# Patient Record
Sex: Female | Born: 1980 | Race: Black or African American | Hispanic: No | Marital: Single | Smoking: Current every day smoker
Health system: Southern US, Community
[De-identification: ages and names within clinical notes are randomized; demographics above are authoritative.]

## PROBLEM LIST (undated history)

## (undated) HISTORY — PX: TONSILLECTOMY: SUR1361

## (undated) HISTORY — PX: ADENOIDECTOMY: SUR15

## (undated) HISTORY — PX: THROAT SURGERY: SHX803

---

## 2004-05-24 ENCOUNTER — Emergency Department (HOSPITAL_COMMUNITY): Admission: EM | Admit: 2004-05-24 | Discharge: 2004-05-24 | Payer: Self-pay | Admitting: Emergency Medicine

## 2005-05-17 ENCOUNTER — Emergency Department (HOSPITAL_COMMUNITY): Admission: EM | Admit: 2005-05-17 | Discharge: 2005-05-17 | Payer: Self-pay | Admitting: Emergency Medicine

## 2005-09-15 ENCOUNTER — Emergency Department (HOSPITAL_COMMUNITY): Admission: EM | Admit: 2005-09-15 | Discharge: 2005-09-15 | Payer: Self-pay | Admitting: Emergency Medicine

## 2006-10-06 ENCOUNTER — Emergency Department (HOSPITAL_COMMUNITY): Admission: EM | Admit: 2006-10-06 | Discharge: 2006-10-06 | Payer: Self-pay | Admitting: Emergency Medicine

## 2007-01-09 ENCOUNTER — Emergency Department (HOSPITAL_COMMUNITY): Admission: EM | Admit: 2007-01-09 | Discharge: 2007-01-09 | Payer: Self-pay | Admitting: Emergency Medicine

## 2007-12-11 ENCOUNTER — Encounter: Admission: RE | Admit: 2007-12-11 | Discharge: 2007-12-11 | Payer: Self-pay | Admitting: Family Medicine

## 2007-12-19 ENCOUNTER — Other Ambulatory Visit: Admission: RE | Admit: 2007-12-19 | Discharge: 2007-12-19 | Payer: Self-pay | Admitting: Interventional Radiology

## 2007-12-19 ENCOUNTER — Encounter: Admission: RE | Admit: 2007-12-19 | Discharge: 2007-12-19 | Payer: Self-pay | Admitting: Family Medicine

## 2007-12-19 ENCOUNTER — Encounter (INDEPENDENT_AMBULATORY_CARE_PROVIDER_SITE_OTHER): Payer: Self-pay | Admitting: Interventional Radiology

## 2007-12-22 ENCOUNTER — Encounter: Admission: RE | Admit: 2007-12-22 | Discharge: 2007-12-22 | Payer: Self-pay | Admitting: Family Medicine

## 2008-05-17 HISTORY — PX: CYSTOSCOPY: SUR368

## 2010-02-10 LAB — TSH: TSH: 1.46 u[IU]/mL (ref <=5.90)

## 2010-02-10 LAB — BASIC METABOLIC PANEL
BUN: 13 mg/dL (ref 4–21)
Creatinine: 0.8 mg/dL (ref <=1.1)
Glucose: 77 mg/dL
Potassium: 4.5 mmol/L (ref 3.4–5.3)
Sodium: 140 mmol/L (ref 137–147)

## 2011-02-26 LAB — BASIC METABOLIC PANEL
BUN: 5 — ABNORMAL LOW
CO2: 23
Calcium: 8.6
Chloride: 103
Creatinine, Ser: 0.6
GFR calc Af Amer: >60
GFR calc non Af Amer: >60
Glucose, Bld: 115 — ABNORMAL HIGH
Potassium: 3.1 — ABNORMAL LOW
Sodium: 134 — ABNORMAL LOW

## 2011-02-26 LAB — DIFFERENTIAL
Basophils Absolute: 0
Basophils Relative: 0
Eosinophils Absolute: 0.1
Eosinophils Relative: 1
Lymphocytes Relative: 18
Lymphs Abs: 2
Monocytes Absolute: 0.6
Monocytes Relative: 6
Neutro Abs: 8.2 — ABNORMAL HIGH
Neutrophils Relative %: 76

## 2011-02-26 LAB — URINALYSIS, ROUTINE W REFLEX MICROSCOPIC
Bilirubin Urine: NEGATIVE
Glucose, UA: NEGATIVE
Hgb urine dipstick: NEGATIVE
Ketones, ur: NEGATIVE
Nitrite: NEGATIVE
Protein, ur: NEGATIVE
Specific Gravity, Urine: >1.03 — ABNORMAL HIGH
Urobilinogen, UA: 0.2
pH: 6

## 2011-02-26 LAB — CBC
HCT: 35.1 — ABNORMAL LOW
Hemoglobin: 12.4
MCHC: 35.2
MCV: 86.3
Platelets: 251
RBC: 4.07
RDW: 14.2 — ABNORMAL HIGH
WBC: 10.9 — ABNORMAL HIGH

## 2011-02-26 LAB — PREGNANCY, URINE: Preg Test, Ur: POSITIVE

## 2011-02-26 LAB — HCG, QUANTITATIVE, PREGNANCY: hCG, Beta Chain, Quant, S: 26412 — ABNORMAL HIGH

## 2011-04-05 ENCOUNTER — Encounter (HOSPITAL_COMMUNITY): Payer: Self-pay | Admitting: *Deleted

## 2011-04-05 ENCOUNTER — Emergency Department (HOSPITAL_COMMUNITY)
Admission: EM | Admit: 2011-04-05 | Discharge: 2011-04-05 | Disposition: A | Discharge status: Home / Self Care | Payer: Medicaid Other | Attending: Emergency Medicine | Admitting: Emergency Medicine

## 2011-04-05 DIAGNOSIS — R5381 Other malaise: Secondary | ICD-10-CM | POA: Insufficient documentation

## 2011-04-05 DIAGNOSIS — F17200 Nicotine dependence, unspecified, uncomplicated: Secondary | ICD-10-CM | POA: Insufficient documentation

## 2011-04-05 DIAGNOSIS — R109 Unspecified abdominal pain: Secondary | ICD-10-CM | POA: Insufficient documentation

## 2011-04-05 DIAGNOSIS — R51 Headache: Secondary | ICD-10-CM | POA: Insufficient documentation

## 2011-04-05 DIAGNOSIS — N390 Urinary tract infection, site not specified: Secondary | ICD-10-CM

## 2011-04-05 DIAGNOSIS — R070 Pain in throat: Secondary | ICD-10-CM | POA: Insufficient documentation

## 2011-04-05 LAB — URINALYSIS, ROUTINE W REFLEX MICROSCOPIC
Bilirubin Urine: NEGATIVE
Glucose, UA: NEGATIVE mg/dL
Hgb urine dipstick: NEGATIVE
Ketones, ur: NEGATIVE mg/dL
Nitrite: NEGATIVE
Protein, ur: NEGATIVE mg/dL
Specific Gravity, Urine: 1.02 (ref 1.005–1.030)
Urobilinogen, UA: 0.2 mg/dL (ref 0.0–1.0)
pH: 6 (ref 5.0–8.0)

## 2011-04-05 LAB — URINE MICROSCOPIC-ADD ON

## 2011-04-05 LAB — PREGNANCY, URINE: Preg Test, Ur: NEGATIVE

## 2011-04-05 MED ORDER — IBUPROFEN 800 MG PO TABS
800.0000 mg | ORAL_TABLET | Freq: Once | ORAL | Status: AC
  Administered 2011-04-05: 800 mg via ORAL
  Filled 2011-04-05: qty 1

## 2011-04-05 MED ORDER — NITROFURANTOIN MACROCRYSTAL 100 MG PO CAPS: 100.0000 mg | ORAL_CAPSULE | Freq: Four times a day (QID) | ORAL | Status: AC

## 2011-04-05 MED ORDER — GUAIFENESIN-CODEINE 100-10 MG/5ML PO SOLN
5.0000 mL | Freq: Once | ORAL | Status: AC
  Administered 2011-04-05: 5 mL via ORAL
  Filled 2011-04-05: qty 5

## 2011-04-05 MED ORDER — NITROFURANTOIN MACROCRYSTAL 100 MG PO CAPS
100.0000 mg | ORAL_CAPSULE | Freq: Once | ORAL | Status: AC
  Administered 2011-04-05: 100 mg via ORAL
  Filled 2011-04-05: qty 1

## 2011-04-05 NOTE — ED Notes (Signed)
Pt c/o fever and chills x 2 weeks. Pt also c/o whitish vaginal discharge and sore throat x 1 week.

## 2011-04-05 NOTE — ED Provider Notes (Signed)
History     CSN: [id number] Arrival date & time: 04/05/2011 12:48 AM   First MD Initiated Contact with Patient 04/05/11 0232      Chief Complaint  Patient presents with  . Sore Throat  . Fever  . Vaginal Discharge  . Chills    (Consider location/radiation/quality/duration/timing/severity/associated sxs/prior treatment) HPI Comments: Cristina Hicks is a 30 y.o. female who presents to the Emergency Department complaining of generalized weakness, myalgias, dry cough, sore throat, lower abdominal pain that began two days ago. She may have had fever but did not take her temperature. Associated with chills. Denies shortness of breath, chest pain, nausea, vomiting, diarrhea.She has taken nothing for the symptoms.   Patient is a 30 y.o. female presenting with weakness. The history is provided by the patient.  Weakness The primary symptoms include headaches and fever. Primary symptoms comment: body aches The symptoms began 2 days ago. The symptoms are unchanged.  The headache is associated with weakness.   Additional symptoms include weakness.    History reviewed. No pertinent past medical history.  Past Surgical History  Procedure Date  . Throat surgery     History reviewed. No pertinent family history.  History  Substance Use Topics  . Smoking status: Current Everyday Smoker -- 1.0 packs/day    Types: Cigarettes  . Smokeless tobacco: Not on file  . Alcohol Use: No    OB History    Grav Para Term Preterm Abortions TAB SAB Ect Mult Living                  Review of Systems  Constitutional: Positive for fever.  Neurological: Positive for weakness and headaches.  All other systems reviewed and are negative.    Allergies  Review of patient's allergies indicates no known allergies.  Home Medications  No current outpatient prescriptions on file.  BP 122/81  Pulse 67  Temp 97.5 F (36.4 C)  Resp 20  Ht 5\' 6"  (1.676 m)  Wt 284 lb (128.822 kg)  BMI 45.84 kg/m2   SpO2 99%  LMP 03/06/2011  Physical Exam  Nursing note and vitals reviewed. Constitutional: She is oriented to person, place, and time. She appears well-developed and well-nourished. No distress.  HENT:  Head: Normocephalic and atraumatic.  Right Ear: External ear normal.  Left Ear: External ear normal.  Mouth/Throat: Oropharynx is clear and moist.  Eyes: EOM are normal.  Neck: Normal range of motion. Neck supple.  Cardiovascular: Normal rate, normal heart sounds and intact distal pulses.   Pulmonary/Chest: Effort normal and breath sounds normal. No respiratory distress. She has no wheezes. She has no rales.  Abdominal: Soft. She exhibits no distension. There is tenderness. There is no rebound and no guarding.       Mild suprapubic tenderness  Genitourinary:       No cva tenderness  Musculoskeletal: Normal range of motion.  Neurological: She is alert and oriented to person, place, and time.  Skin: Skin is warm and dry.    ED Course  Procedures (including critical care time)  Results for orders placed during the hospital encounter of 04/05/11  URINALYSIS, ROUTINE W REFLEX MICROSCOPIC      Component Value Range   Color, Urine YELLOW  YELLOW    Appearance HAZY (*) CLEAR    Specific Gravity, Urine 1.020  1.005 - 1.030    pH 6.0  5.0 - 8.0    Glucose, UA NEGATIVE  NEGATIVE (mg/dL)   Hgb urine dipstick NEGATIVE  NEGATIVE  Bilirubin Urine NEGATIVE  NEGATIVE    Ketones, ur NEGATIVE  NEGATIVE (mg/dL)   Protein, ur NEGATIVE  NEGATIVE (mg/dL)   Urobilinogen, UA 0.2  0.0 - 1.0 (mg/dL)   Nitrite NEGATIVE  NEGATIVE    Leukocytes, UA SMALL (*) NEGATIVE   PREGNANCY, URINE      Component Value Range   Preg Test, Ur NEGATIVE    URINE MICROSCOPIC-ADD ON      Component Value Range   Squamous Epithelial / LPF FEW (*) RARE    WBC, UA 11-20  <3 (WBC/hpf)   RBC / HPF 0-2  <3 (RBC/hpf)   Bacteria, UA MANY (*) RARE    Urine-Other CLUE CELLS PRESENT       No diagnosis found.    MDM   Patient with generalized malaise and positive for urinary tract infection. No CVA tenderness or indication of pyelonephitis. Began antibiotic treatment.The patient appears reasonably screened and/or stabilized for discharge and I doubt any other medical condition or other [facility] requiring further screening, evaluation, or treatment in the ED at this time prior to discharge.  MDM Reviewed: nursing note and vitals Interpretation: labs           Nicoletta Dress. Colon Branch, MD 04/05/11 [phone]

## 2011-04-30 ENCOUNTER — Encounter (HOSPITAL_COMMUNITY): Payer: Self-pay | Admitting: Emergency Medicine

## 2011-04-30 ENCOUNTER — Emergency Department (HOSPITAL_COMMUNITY)
Admission: EM | Admit: 2011-04-30 | Discharge: 2011-04-30 | Disposition: A | Discharge status: Home / Self Care | Payer: Medicaid Other | Attending: Emergency Medicine | Admitting: Emergency Medicine

## 2011-04-30 DIAGNOSIS — Z9889 Other specified postprocedural states: Secondary | ICD-10-CM | POA: Insufficient documentation

## 2011-04-30 DIAGNOSIS — M545 Low back pain, unspecified: Secondary | ICD-10-CM | POA: Insufficient documentation

## 2011-04-30 DIAGNOSIS — M79609 Pain in unspecified limb: Secondary | ICD-10-CM | POA: Insufficient documentation

## 2011-04-30 DIAGNOSIS — R209 Unspecified disturbances of skin sensation: Secondary | ICD-10-CM | POA: Insufficient documentation

## 2011-04-30 DIAGNOSIS — F17200 Nicotine dependence, unspecified, uncomplicated: Secondary | ICD-10-CM | POA: Insufficient documentation

## 2011-04-30 MED ORDER — DIAZEPAM 5 MG PO TABS
5.0000 mg | ORAL_TABLET | Freq: Once | ORAL | Status: AC
  Administered 2011-04-30: 5 mg via ORAL
  Filled 2011-04-30: qty 1

## 2011-04-30 MED ORDER — HYDROMORPHONE HCL PF 1 MG/ML IJ SOLN
1.0000 mg | Freq: Once | INTRAMUSCULAR | Status: AC
  Administered 2011-04-30: 1 mg via INTRAMUSCULAR
  Filled 2011-04-30: qty 1

## 2011-04-30 MED ORDER — IBUPROFEN 800 MG PO TABS: 800.0000 mg | ORAL_TABLET | Freq: Three times a day (TID) | ORAL | Status: AC

## 2011-04-30 MED ORDER — HYDROCODONE-ACETAMINOPHEN 5-325 MG PO TABS: 1.0000 | ORAL_TABLET | ORAL | Status: AC | PRN

## 2011-04-30 MED ORDER — DIAZEPAM 5 MG PO TABS: 5.0000 mg | ORAL_TABLET | Freq: Two times a day (BID) | ORAL | Status: AC

## 2011-04-30 MED ORDER — KETOROLAC TROMETHAMINE 60 MG/2ML IM SOLN
60.0000 mg | Freq: Once | INTRAMUSCULAR | Status: AC
  Administered 2011-04-30: 60 mg via INTRAMUSCULAR
  Filled 2011-04-30: qty 2

## 2011-04-30 NOTE — ED Notes (Signed)
Started yesterday morning spasming back pain worse today unable to walk hurts worse with movement

## 2011-04-30 NOTE — ED Provider Notes (Signed)
History     CSN: [id number] Arrival date & time: 04/30/2011  9:23 AM   First MD Initiated Contact with Patient 04/30/11 [phone]      Chief Complaint  Patient presents with  . Back Pain    (Consider location/radiation/quality/duration/timing/severity/associated sxs/prior treatment) HPI Pt c/o diffuse low back w/ radiation to upper back and legs since yesterday.  Aggravated by certain positions and walking.  No relief w/ tylenol.  Associated w/ bilateral foot numbness.  Denies fever, bladder/bowel dysfunction and lower extremity weakness.  Denies trauma.  Has had similar symptoms in the past.   History reviewed. No pertinent past medical history.  Past Surgical History  Procedure Date  . Throat surgery   . Cystoscopy 2010    removed from thyroid    No family history on file.  History  Substance Use Topics  . Smoking status: Current Everyday Smoker -- 0.5 packs/day for 5 years    Types: Cigarettes  . Smokeless tobacco: Not on file  . Alcohol Use: Yes     occausional    OB History    Grav Para Term Preterm Abortions TAB SAB Ect Mult Living                  Review of Systems  All other systems reviewed and are negative.    Allergies  Review of patient's allergies indicates no known allergies.  Home Medications   Current Outpatient Rx  Name Route Sig Dispense Refill  . ACETAMINOPHEN 500 MG PO TABS Oral Take 1,000 mg by mouth every 6 (six) hours as needed. For back pain     . DIAZEPAM 5 MG PO TABS Oral Take 1 tablet (5 mg total) by mouth 2 (two) times daily. 10 tablet 0  . HYDROCODONE-ACETAMINOPHEN 5-325 MG PO TABS Oral Take 1 tablet by mouth every 4 (four) hours as needed for pain. 20 tablet 0  . IBUPROFEN 800 MG PO TABS Oral Take 1 tablet (800 mg total) by mouth 3 (three) times daily. 12 tablet 0    BP 126/77  Pulse 88  Temp(Src) 98.1 F (36.7 C) (Oral)  Resp 20  SpO2 98%  LMP 04/20/2011  Physical Exam  Nursing note and vitals reviewed. Constitutional:  She is oriented to person, place, and time. She appears well-developed and well-nourished.  HENT:  Head: Normocephalic and atraumatic.  Eyes:       Normal appearance  Neck: Normal range of motion.  Cardiovascular: Normal rate and regular rhythm.   Pulmonary/Chest: Effort normal and breath sounds normal.  Musculoskeletal:       Lumbar spinal and paraspinal ttp. No CVA ttp. Full ROM of LE.  Nml patellar reflexes.  No saddle anesthesia. Distal sensation intact.  2+ DP pulses.  Ambulates w/out diffulty.   Neurological: She is alert and oriented to person, place, and time.  Skin: Skin is warm and dry. No rash noted.  Psychiatric: She has a normal mood and affect. Her behavior is normal.    ED Course  Procedures (including critical care time)  Labs Reviewed - No data to display No results found.   1. Low back pain       MDM  Healthy 30yo F presents w/ non-traumatic low back pain.   Has intermittent spasming.  No red flag s/sx.  Pt received valium and toradol w/ a small amt of relief.  Then treated her w/ 1mg  IM dilaudid.  She is ambulating w/out difficulty.   Most likely has a lumbar strain.  Recommended rest, NSAID,  ice/heat and f/u with her PCP if pain has not started to improve in 7 days.  Referred to NS as well.  Return precautions discussed.         Cristina Hicks, [place] 04/30/11 2022

## 2011-05-01 NOTE — ED Provider Notes (Signed)
Medical screening examination/treatment/procedure(s) were performed by non-physician practitioner and as supervising physician I was immediately available for consultation/collaboration.    Diella Gillingham R Riggs Dineen, MD 05/01/11 0719 

## 2011-12-12 ENCOUNTER — Encounter (HOSPITAL_COMMUNITY): Payer: Self-pay

## 2011-12-12 ENCOUNTER — Emergency Department (HOSPITAL_COMMUNITY)
Admission: EM | Admit: 2011-12-12 | Discharge: 2011-12-12 | Disposition: A | Discharge status: Home / Self Care | Payer: Medicaid Other | Attending: Emergency Medicine | Admitting: Emergency Medicine

## 2011-12-12 ENCOUNTER — Other Ambulatory Visit: Payer: Self-pay

## 2011-12-12 DIAGNOSIS — R002 Palpitations: Secondary | ICD-10-CM | POA: Insufficient documentation

## 2011-12-12 DIAGNOSIS — F17200 Nicotine dependence, unspecified, uncomplicated: Secondary | ICD-10-CM | POA: Insufficient documentation

## 2011-12-12 LAB — COMPREHENSIVE METABOLIC PANEL
ALT: 9 U/L (ref 0–35)
Alkaline Phosphatase: 87 U/L (ref 39–117)
CO2: 30 mEq/L (ref 19–32)
GFR calc Af Amer: >90 mL/min (ref >90)
GFR calc non Af Amer: >90 mL/min (ref >90)
Glucose, Bld: 83 mg/dL (ref 70–99)
Potassium: 3.4 mEq/L — ABNORMAL LOW (ref 3.5–5.1)
Sodium: 142 mEq/L (ref 135–145)

## 2011-12-12 LAB — CBC WITH DIFFERENTIAL/PLATELET
Lymphocytes Relative: 27 % (ref 12–46)
Lymphs Abs: 2.3 10*3/uL (ref 0.7–4.0)
Neutrophils Relative %: 65 % (ref 43–77)
Platelets: 231 10*3/uL (ref 150–400)
RBC: 4.14 MIL/uL (ref 3.87–5.11)
WBC: 8.4 10*3/uL (ref 4.0–10.5)

## 2011-12-12 NOTE — ED Notes (Signed)
Heart has felt funny for 3 weeks. Last night I had sharp pains in my shoulder and I went to sleep and when I woke up it was gone per pt. Feet have been swelling per pt.

## 2011-12-12 NOTE — ED Provider Notes (Signed)
History  This chart was scribed for Geoffery Lyons, MD by Ladona Ridgel Day. This patient was seen in room APA06/APA06 and the patient's care was started at 2034.   CSN: [id number]  Arrival date & time 12/12/11  2034   First MD Initiated Contact with Patient 12/12/11 2046      Chief Complaint  Patient presents with  . Palpitations    Patient is a 31 y.o. female presenting with palpitations. The history is provided by the patient. No language interpreter was used.  Palpitations  The current episode started more than 1 week ago. The problem occurs daily. The problem has been resolved. Associated with: Nothing.  Pertinent negatives include no diaphoresis and no chest pain. She has tried nothing for the symptoms.   Cristina Hicks is a 31 y.o. female who presents to the Emergency Department complaining of ongoing intermittent palpitations for past several weeks. She states her symptoms only lasted a few seconds and denies any precipitating factors. She states her symptoms seem resolved now but they randomly come back on a daily basis several times a day. She states when her symptoms do return, her heart feels funny. She states does mild lifting at the [employer] where she works. She denies medical history and any other injuries or illnesses.   [facility] Edelman is PCP  History reviewed. No pertinent past medical history.  Past Surgical History  Procedure Date  . Throat surgery   . Cystoscopy 2010    removed from thyroid    History reviewed. No pertinent family history.  History  Substance Use Topics  . Smoking status: Current Everyday Smoker -- 0.5 packs/day for 5 years    Types: Cigarettes  . Smokeless tobacco: Not on file  . Alcohol Use: Yes     occausional    OB History    Grav Para Term Preterm Abortions TAB SAB Ect Mult Living                  Review of Systems  Constitutional: Negative for diaphoresis.  Cardiovascular: Positive for palpitations. Negative for chest pain.   All other systems reviewed and are negative.   A complete 10 system review of systems was obtained and all systems are negative except as noted in the HPI and PMH.   Allergies  Review of patient's allergies indicates no known allergies.  Home Medications   No current outpatient prescriptions on file.  Triage Vitals: BP 127/70  Pulse 71  Temp 98 F (36.7 C) (Oral)  Resp 14  Ht 5\' 6"  (1.676 m)  Wt 270 lb (122.471 kg)  BMI 43.58 kg/m2  SpO2 100%  LMP 11/20/2011  Physical Exam  Nursing note and vitals reviewed. Constitutional: She is oriented to person, place, and time. She appears well-developed and well-nourished. No distress.  HENT:  Head: Normocephalic and atraumatic.  Mouth/Throat: Oropharynx is clear and moist.  Eyes: Conjunctivae and EOM are normal.  Neck: Normal range of motion. Neck supple. No tracheal deviation present.  Cardiovascular: Normal rate, regular rhythm and normal heart sounds.   Pulmonary/Chest: Effort normal and breath sounds normal. No respiratory distress. She has no wheezes. She has no rales.  Abdominal: Soft. Bowel sounds are normal. She exhibits no distension. There is no tenderness. There is no rebound and no guarding.  Musculoskeletal: Normal range of motion.  Neurological: She is alert and oriented to person, place, and time.  Skin: Skin is warm and dry.  Psychiatric: She has a normal mood and affect. Her  behavior is normal.    ED Course  Procedures (including critical care time) DIAGNOSTIC STUDIES: Oxygen Saturation is 100% on room air, normal by my interpretation.    COORDINATION OF CARE: At 39 PM Discussed treatment plan with patient which includes observation and blood work. Patient agrees.    Labs Reviewed  TSH  CBC WITH DIFFERENTIAL  COMPREHENSIVE METABOLIC PANEL  TROPONIN I   No results found.   No diagnosis found.   Date: 12/12/2011  Rate: 73  Rhythm: sinus rhythm  QRS Axis: normal  Intervals: normal  ST/T Wave  abnormalities: normal  Conduction Disutrbances:none  Narrative Interpretation:   Old EKG Reviewed: none available    MDM  The patient presents with palpitations intermittently for the past several weeks.  The ekg and electrolytes are normal with a tsh pending.  She has been observed on the cardiac monitor for the past 2 hours and has had no symptoms or ectopy.  At this point I feel she is stable for discharge with referral to cardiology to discuss Holter monitoring.  She is to return for any problems or if symptoms worsen.      I personally performed the services described in this documentation, which was scribed in my presence. The recorded information has been reviewed and considered.           Geoffery Lyons, MD 12/12/11 2231

## 2011-12-12 NOTE — ED Notes (Addendum)
Patient requesting for Dr. Judd Lien to look at rash on left armpit. States that she has history of eczema, but this looks different and she normally doesn't get eczema in that area. Dr. Judd Lien notified.

## 2012-08-30 ENCOUNTER — Encounter: Payer: Self-pay | Admitting: Family Medicine

## 2012-08-30 ENCOUNTER — Ambulatory Visit: Payer: Self-pay | Admitting: Nurse Practitioner

## 2012-08-30 ENCOUNTER — Other Ambulatory Visit: Payer: Self-pay

## 2012-09-05 ENCOUNTER — Emergency Department (HOSPITAL_COMMUNITY)
Admission: EM | Admit: 2012-09-05 | Discharge: 2012-09-05 | Disposition: A | Discharge status: Home / Self Care | Payer: Medicaid Other | Attending: Emergency Medicine | Admitting: Emergency Medicine

## 2012-09-05 ENCOUNTER — Encounter (HOSPITAL_COMMUNITY): Payer: Self-pay | Admitting: *Deleted

## 2012-09-05 DIAGNOSIS — R42 Dizziness and giddiness: Secondary | ICD-10-CM | POA: Insufficient documentation

## 2012-09-05 DIAGNOSIS — R112 Nausea with vomiting, unspecified: Secondary | ICD-10-CM | POA: Insufficient documentation

## 2012-09-05 DIAGNOSIS — F17200 Nicotine dependence, unspecified, uncomplicated: Secondary | ICD-10-CM | POA: Insufficient documentation

## 2012-09-05 DIAGNOSIS — Z3202 Encounter for pregnancy test, result negative: Secondary | ICD-10-CM | POA: Insufficient documentation

## 2012-09-05 LAB — HCG, QUANTITATIVE, PREGNANCY: hCG, Beta Chain, Quant, S: <1 m[IU]/mL (ref <5)

## 2012-09-05 LAB — URINALYSIS, ROUTINE W REFLEX MICROSCOPIC
Bilirubin Urine: NEGATIVE
Hgb urine dipstick: NEGATIVE
Protein, ur: NEGATIVE mg/dL
Specific Gravity, Urine: 1.015 (ref 1.005–1.030)
Urobilinogen, UA: 0.2 mg/dL (ref 0.0–1.0)

## 2012-09-05 LAB — CBC WITH DIFFERENTIAL/PLATELET
Basophils Absolute: 0 10*3/uL (ref 0.0–0.1)
Basophils Relative: 0 % (ref 0–1)
Eosinophils Absolute: 0.2 10*3/uL (ref 0.0–0.7)
Hemoglobin: 13.4 g/dL (ref 12.0–15.0)
MCHC: 35.5 g/dL (ref 30.0–36.0)
Neutro Abs: 8.6 10*3/uL — ABNORMAL HIGH (ref 1.7–7.7)
Neutrophils Relative %: 72 % (ref 43–77)
Platelets: 289 10*3/uL (ref 150–400)
RDW: 13.4 % (ref 11.5–15.5)

## 2012-09-05 LAB — COMPREHENSIVE METABOLIC PANEL
ALT: 10 U/L (ref 0–35)
CO2: 29 mEq/L (ref 19–32)
Calcium: 9.3 mg/dL (ref 8.4–10.5)
Chloride: 103 mEq/L (ref 96–112)
GFR calc Af Amer: 89 mL/min — ABNORMAL LOW (ref >90)
GFR calc non Af Amer: 77 mL/min — ABNORMAL LOW (ref >90)
Glucose, Bld: 97 mg/dL (ref 70–99)
Sodium: 140 mEq/L (ref 135–145)
Total Bilirubin: 0.1 mg/dL — ABNORMAL LOW (ref 0.3–1.2)

## 2012-09-05 MED ORDER — SODIUM CHLORIDE 0.9 % IV BOLUS (SEPSIS)
1000.0000 mL | Freq: Once | INTRAVENOUS | Status: AC
  Administered 2012-09-05: 1000 mL via INTRAVENOUS

## 2012-09-05 NOTE — ED Provider Notes (Signed)
History  This chart was scribed for Loren Racer, MD by Shari Heritage, ED Scribe. The patient was seen in room APA07/APA07. Patient's care was started at 1930.   CSN: [id number]  Arrival date & time 09/05/12  1840   First MD Initiated Contact with Patient 09/05/12 1930      Chief Complaint  Patient presents with  . Dizziness    Patient is a 32 y.o. female presenting with female genitourinary complaint. The history is provided by the patient. No language interpreter was used.  Female GU Problem Primary symptoms include no discharge, no pelvic pain and no dysuria. There has been no fever. This is a new problem. The current episode started 12 to 24 hours ago. The problem occurs constantly. The problem has not changed since onset.LMP: March. The patient's menstrual history has been irregular (reports only spotting). Associated symptoms include light-headedness. Pertinent negatives include no abdominal pain, no nausea, no vomiting and no frequency.    HPI Comments: Cristina Hicks is a 32 y.o. female who presents to the Emergency Department complaining of lightheadedness with sudden position changes for the past 1-2 days. There is associated nausea and vomiting. She states that she had a couple of episodes of vomiting earlier today, but none in the ED. Patient is also concerned that she may be pregnant. She states that her LNMP was in February. She says that she usually has menstrual periods every  onth. She says that in March she had only vaginal spotting. Patient took two pregnancy tests at home, one was positive and one was negative. She states that she has not noticed any vaginal discharge or bleeding this month. Patient denies abdominal pain, dysuria, urinary frequency, fever, shills, chest pain, shortness of breath or any other symptoms.    History reviewed. No pertinent past medical history.  Past Surgical History  Procedure Laterality Date  . Throat surgery    . Cystoscopy  2010     removed from thyroid    History reviewed. No pertinent family history.  History  Substance Use Topics  . Smoking status: Current Every Day Smoker -- 0.50 packs/day for 5 years    Types: Cigarettes  . Smokeless tobacco: Not on file  . Alcohol Use: Yes     Comment: occausional    OB History   Grav Para Term Preterm Abortions TAB SAB Ect Mult Living                  Review of Systems  Constitutional: Negative for fever and chills.  HENT: Negative for congestion and rhinorrhea.   Eyes: Negative for visual disturbance.  Respiratory: Negative for cough and shortness of breath.   Cardiovascular: Negative for chest pain.  Gastrointestinal: Negative for nausea, vomiting and abdominal pain.  Genitourinary: Negative for dysuria, frequency and pelvic pain.  Musculoskeletal: Negative for back pain.  Skin: Negative for rash.  Neurological: Positive for light-headedness.  Psychiatric/Behavioral: Negative for confusion.    Allergies  Review of patient's allergies indicates no known allergies.  Home Medications   Current Outpatient Rx  Name  Route  Sig  Dispense  Refill  . Vitamin D, Ergocalciferol, (DRISDOL) 50000 UNITS CAPS   Oral   Take 50,000 Units by mouth every 7 (seven) days. SUNDAYS           Triage Vitals: BP 130/72  Pulse 87  Temp(Src) 99.3 F (37.4 C) (Oral)  Resp 18  Ht 5\' 6"  (1.676 m)  Wt 262 lb (118.842 kg)  BMI 42.31  kg/m2  SpO2 100%  Physical Exam  Constitutional: She is oriented to person, place, and time. She appears well-developed and well-nourished.  HENT:  Head: Normocephalic.  Mouth/Throat: Oropharynx is clear and moist.  Eyes: Conjunctivae and EOM are normal. Pupils are equal, round, and reactive to light.  Neck: Normal range of motion. Neck supple.  Cardiovascular: Normal rate, regular rhythm and normal heart sounds.   No murmur heard. Pulmonary/Chest: Effort normal and breath sounds normal.  Abdominal: Soft. There is no tenderness.   Musculoskeletal: Normal range of motion.  Neurological: She is alert and oriented to person, place, and time.  Skin: Skin is warm and dry.    ED Course  Procedures (including critical care time) DIAGNOSTIC STUDIES: Oxygen Saturation is 100% on room air, normal by my interpretation.    COORDINATION OF CARE: 7:50 PM- Patient informed of current plan for treatment and evaluation and agrees with plan at this time.      Labs Reviewed  URINALYSIS, ROUTINE W REFLEX MICROSCOPIC - Abnormal; Notable for the following:    Ketones, ur TRACE (*)    All other components within normal limits  COMPREHENSIVE METABOLIC PANEL - Abnormal; Notable for the following:    Albumin 3.0 (*)    Total Bilirubin 0.1 (*)    GFR calc non Af Amer 77 (*)    GFR calc Af Amer 89 (*)    All other components within normal limits  CBC WITH DIFFERENTIAL - Abnormal; Notable for the following:    WBC 12.1 (*)    Neutro Abs 8.6 (*)    All other components within normal limits  PREGNANCY, URINE  HCG, QUANTITATIVE, PREGNANCY   No results found.   1. Lightheadedness      Date: 09/05/2012  Rate: 72  Rhythm: normal sinus rhythm  QRS Axis: normal  Intervals: normal  ST/T Wave abnormalities: normal  Conduction Disutrbances:none  Narrative Interpretation:   Old EKG Reviewed: unchanged    MDM  I personally performed the services described in this documentation, which was scribed in my presence. The recorded information has been reviewed and is accurate.     Loren Racer, MD 09/05/12 2131

## 2012-09-05 NOTE — ED Notes (Signed)
Pt alert & oriented x4, stable gait. Patient given discharge instructions, paperwork & prescription(s). Patient  instructed to stop at the registration desk to finish any additional paperwork. Patient verbalized understanding. Pt left department w/ no further questions. 

## 2012-09-05 NOTE — ED Notes (Signed)
Pt with dizziness since yesterday, took pregnancy test this morning is positive and retook this afternoon pregnancy test and was negative, + nausea and vomiting

## 2012-12-16 ENCOUNTER — Inpatient Hospital Stay (HOSPITAL_COMMUNITY)
Admission: EM | Admit: 2012-12-16 | Discharge: 2012-12-17 | DRG: 392 | Disposition: A | Discharge status: Home / Self Care | Payer: Medicaid Other | Attending: Internal Medicine | Admitting: Internal Medicine

## 2012-12-16 ENCOUNTER — Encounter (HOSPITAL_COMMUNITY): Payer: Self-pay | Admitting: *Deleted

## 2012-12-16 ENCOUNTER — Emergency Department (HOSPITAL_COMMUNITY): Payer: Medicaid Other

## 2012-12-16 DIAGNOSIS — K529 Noninfective gastroenteritis and colitis, unspecified: Secondary | ICD-10-CM

## 2012-12-16 DIAGNOSIS — F17200 Nicotine dependence, unspecified, uncomplicated: Secondary | ICD-10-CM | POA: Diagnosis present

## 2012-12-16 DIAGNOSIS — R509 Fever, unspecified: Secondary | ICD-10-CM

## 2012-12-16 DIAGNOSIS — K5289 Other specified noninfective gastroenteritis and colitis: Principal | ICD-10-CM | POA: Diagnosis present

## 2012-12-16 DIAGNOSIS — E860 Dehydration: Secondary | ICD-10-CM

## 2012-12-16 DIAGNOSIS — D72829 Elevated white blood cell count, unspecified: Secondary | ICD-10-CM | POA: Diagnosis present

## 2012-12-16 LAB — COMPREHENSIVE METABOLIC PANEL
AST: 16 U/L (ref 0–37)
Albumin: 3.7 g/dL (ref 3.5–5.2)
BUN: 15 mg/dL (ref 6–23)
Chloride: 99 mEq/L (ref 96–112)
Creatinine, Ser: 1.04 mg/dL (ref 0.50–1.10)
Potassium: 4 mEq/L (ref 3.5–5.1)
Total Bilirubin: 0.4 mg/dL (ref 0.3–1.2)
Total Protein: 8.3 g/dL (ref 6.0–8.3)

## 2012-12-16 LAB — CBC WITH DIFFERENTIAL/PLATELET
Basophils Absolute: 0 10*3/uL (ref 0.0–0.1)
Basophils Relative: 0 % (ref 0–1)
Eosinophils Absolute: 0.1 10*3/uL (ref 0.0–0.7)
MCH: 30.2 pg (ref 26.0–34.0)
MCHC: 34.8 g/dL (ref 30.0–36.0)
Monocytes Absolute: 1.6 10*3/uL — ABNORMAL HIGH (ref 0.1–1.0)
Monocytes Relative: 7 % (ref 3–12)
Neutro Abs: 20.5 10*3/uL — ABNORMAL HIGH (ref 1.7–7.7)
Neutrophils Relative %: 91 % — ABNORMAL HIGH (ref 43–77)
RDW: 13.6 % (ref 11.5–15.5)

## 2012-12-16 LAB — URINALYSIS, ROUTINE W REFLEX MICROSCOPIC
Bilirubin Urine: NEGATIVE
Ketones, ur: NEGATIVE mg/dL
Leukocytes, UA: NEGATIVE
Nitrite: NEGATIVE

## 2012-12-16 LAB — URINE MICROSCOPIC-ADD ON

## 2012-12-16 LAB — LIPASE, BLOOD: Lipase: 24 U/L (ref 11–59)

## 2012-12-16 LAB — PREGNANCY, URINE: Preg Test, Ur: NEGATIVE

## 2012-12-16 MED ORDER — ACETAMINOPHEN 500 MG PO TABS
1000.0000 mg | ORAL_TABLET | Freq: Once | ORAL | Status: AC
  Administered 2012-12-16: 1000 mg via ORAL
  Filled 2012-12-16: qty 1

## 2012-12-16 MED ORDER — SODIUM CHLORIDE 0.9 % IV SOLN
INTRAVENOUS | Status: DC
  Administered 2012-12-16: 1000 mL via INTRAVENOUS
  Administered 2012-12-17: 03:00:00 via INTRAVENOUS

## 2012-12-16 MED ORDER — MORPHINE SULFATE 4 MG/ML IJ SOLN
4.0000 mg | INTRAMUSCULAR | Status: DC | PRN
  Administered 2012-12-16: 4 mg via INTRAVENOUS
  Filled 2012-12-16: qty 1

## 2012-12-16 MED ORDER — IOHEXOL 300 MG/ML  SOLN: 50.0000 mL | Freq: Once | INTRAMUSCULAR | Status: AC | PRN

## 2012-12-16 MED ORDER — ONDANSETRON HCL 4 MG/2ML IJ SOLN
4.0000 mg | INTRAMUSCULAR | Status: DC | PRN
  Administered 2012-12-16: 4 mg via INTRAVENOUS
  Filled 2012-12-16: qty 2

## 2012-12-16 MED ORDER — IOHEXOL 300 MG/ML  SOLN
50.0000 mL | Freq: Once | INTRAMUSCULAR | Status: AC | PRN
  Administered 2012-12-16: 50 mL via ORAL

## 2012-12-16 MED ORDER — IOHEXOL 300 MG/ML  SOLN
100.0000 mL | Freq: Once | INTRAMUSCULAR | Status: AC | PRN
  Administered 2012-12-16: 100 mL via INTRAVENOUS

## 2012-12-16 MED ORDER — FAMOTIDINE IN NACL 20-0.9 MG/50ML-% IV SOLN
20.0000 mg | Freq: Once | INTRAVENOUS | Status: AC
  Administered 2012-12-16: 20 mg via INTRAVENOUS
  Filled 2012-12-16: qty 50

## 2012-12-16 NOTE — ED Notes (Signed)
Pt reporting abdominal pain, nausea and vomiting starting this morning.

## 2012-12-16 NOTE — ED Provider Notes (Addendum)
CSN: [id number]     Arrival date & time 12/16/12  1935 History     First MD Initiated Contact with Patient 12/16/12 2000     Chief Complaint  Patient presents with  . Abdominal Pain  . Nausea  . Emesis    HPI Pt was seen at 2015.  Per pt, c/o gradual onset and persistence of constant upper abd "pain" since this afternoon approx 1300. Pt states the pain began after she ate a hotdog and a hamburger. Has been associated with multiple intermittent episodes of N/V/D.  Describes the abd pain as "cramping."  Denies fevers, no back pain, no rash, no CP/SOB, no black or blood in stools or emesis.      History reviewed. No pertinent past medical history.   Past Surgical History  Procedure Laterality Date  . Throat surgery    . Cystoscopy  2010    removed from thyroid    History  Substance Use Topics  . Smoking status: Current Every Day Smoker -- 1.00 packs/day for 5 years    Types: Cigarettes  . Smokeless tobacco: Not on file  . Alcohol Use: Yes     Comment: occasional    Review of Systems ROS: Statement: All systems negative except as marked or noted in the HPI; Constitutional: Negative for fever and chills. ; ; Eyes: Negative for eye pain, redness and discharge. ; ; ENMT: Negative for ear pain, hoarseness, nasal congestion, sinus pressure and sore throat. ; ; Cardiovascular: Negative for chest pain, palpitations, diaphoresis, dyspnea and peripheral edema. ; ; Respiratory: Negative for cough, wheezing and stridor. ; ; Gastrointestinal: +N/V/D, abd pain. Negative for blood in stool, hematemesis, jaundice and rectal bleeding. . ; ; Genitourinary: Negative for dysuria, flank pain and hematuria. ; ; Musculoskeletal: Negative for back pain and neck pain. Negative for swelling and trauma.; ; Skin: Negative for pruritus, rash, abrasions, blisters, bruising and skin lesion.; ; Neuro: Negative for headache, lightheadedness and neck stiffness. Negative for weakness, altered level of consciousness ,  altered mental status, extremity weakness, paresthesias, involuntary movement, seizure and syncope.       Allergies  Review of patient's allergies indicates no known allergies.  Home Medications  No current outpatient prescriptions on file. BP 124/85  Pulse 52  Temp(Src) 99 F (37.2 C) (Oral)  Resp 18  Ht 5\' 6"  (1.676 m)  Wt 251 lb 7 oz (114.051 kg)  BMI 40.6 kg/m2  SpO2 99%  LMP 12/16/2012 Physical Exam 2020: Physical examination:  Nursing notes reviewed; Vital signs and O2 SAT reviewed;  Constitutional: Well developed, Well nourished, Uncomfortable appearing; Head:  Normocephalic, atraumatic; Eyes: EOMI, PERRL, No scleral icterus; ENMT: Mouth and pharynx normal, Mucous membranes dry; Neck: Supple, Full range of motion, No lymphadenopathy; Cardiovascular: Regular rate and rhythm, No murmur, rub, or gallop; Respiratory: Breath sounds clear & equal bilaterally, No rales, rhonchi, wheezes.  Speaking full sentences with ease, Normal respiratory effort/excursion; Chest: Nontender, Movement normal; Abdomen: Soft, +mid-epigastric tenderness to palp. No rebound or guarding. Nondistended, Normal bowel sounds; Genitourinary: No CVA tenderness; Extremities: Pulses normal, No tenderness, No edema, No calf edema or asymmetry.; Neuro: AA&Ox3, Major CN grossly intact.  Speech clear. No gross focal motor or sensory deficits in extremities.; Skin: Color normal, Warm, Dry.   ED Course   Procedures    MDM  MDM Reviewed: previous chart, nursing note and vitals Reviewed previous: labs Interpretation: labs, x-ray and CT scan   Results for orders placed during the hospital encounter of 12/16/12  PREGNANCY, URINE      Result Value Range   Preg Test, Ur NEGATIVE  NEGATIVE  URINALYSIS, ROUTINE W REFLEX MICROSCOPIC      Result Value Range   Color, Urine BROWN (*) YELLOW   APPearance TURBID (*) CLEAR   Specific Gravity, Urine >1.030 (*) 1.005 - 1.030   pH 5.5  5.0 - 8.0   Glucose, UA NEGATIVE   NEGATIVE mg/dL   Hgb urine dipstick LARGE (*) NEGATIVE   Bilirubin Urine NEGATIVE  NEGATIVE   Ketones, ur NEGATIVE  NEGATIVE mg/dL   Protein, ur TRACE (*) NEGATIVE mg/dL   Urobilinogen, UA 0.2  0.0 - 1.0 mg/dL   Nitrite NEGATIVE  NEGATIVE   Leukocytes, UA NEGATIVE  NEGATIVE  CBC WITH DIFFERENTIAL      Result Value Range   WBC 22.6 (*) 4.0 - 10.5 K/uL   RBC 5.16 (*) 3.87 - 5.11 MIL/uL   Hemoglobin 15.6 (*) 12.0 - 15.0 g/dL   HCT 16.1  09.6 - 04.5 %   MCV 86.8  78.0 - 100.0 fL   MCH 30.2  26.0 - 34.0 pg   MCHC 34.8  30.0 - 36.0 g/dL   RDW 40.9  81.1 - 91.4 %   Platelets 289  150 - 400 K/uL   Neutrophils Relative % 91 (*) 43 - 77 %   Neutro Abs 20.5 (*) 1.7 - 7.7 K/uL   Lymphocytes Relative 2 (*) 12 - 46 %   Lymphs Abs 0.5 (*) 0.7 - 4.0 K/uL   Monocytes Relative 7  3 - 12 %   Monocytes Absolute 1.6 (*) 0.1 - 1.0 K/uL   Eosinophils Relative 0  0 - 5 %   Eosinophils Absolute 0.1  0.0 - 0.7 K/uL   Basophils Relative 0  0 - 1 %   Basophils Absolute 0.0  0.0 - 0.1 K/uL  COMPREHENSIVE METABOLIC PANEL      Result Value Range   Sodium 139  135 - 145 mEq/L   Potassium 4.0  3.5 - 5.1 mEq/L   Chloride 99  96 - 112 mEq/L   CO2 26  19 - 32 mEq/L   Glucose, Bld 112 (*) 70 - 99 mg/dL   BUN 15  6 - 23 mg/dL   Creatinine, Ser 7.82  0.50 - 1.10 mg/dL   Calcium 9.5  8.4 - 95.6 mg/dL   Total Protein 8.3  6.0 - 8.3 g/dL   Albumin 3.7  3.5 - 5.2 g/dL   AST 16  0 - 37 U/L   ALT 10  0 - 35 U/L   Alkaline Phosphatase 96  39 - 117 U/L   Total Bilirubin 0.4  0.3 - 1.2 mg/dL   GFR calc non Af Amer 70 (*) >90 mL/min   GFR calc Af Amer 81 (*) >90 mL/min  LIPASE, BLOOD      Result Value Range   Lipase 24  11 - 59 U/L  URINE MICROSCOPIC-ADD ON      Result Value Range   WBC, UA 0-2  <3 WBC/hpf   RBC / HPF TOO NUMEROUS TO COUNT  <3 RBC/hpf   Bacteria, UA FEW (*) RARE   Dg Chest 2 View 12/16/2012   *RADIOLOGY REPORT*  Clinical Data: Abdominal pain and nausea.  CHEST - 2 VIEW  Comparison: None.   Findings: No significant osseous abnormality.  Lungs are clear. No effusion or pneumothorax.  Cardiomediastinal size and contour are within normal limits.  No pneumoperitoneum.  Enteric contrast in  the stomach.  IMPRESSION: No evidence of acute cardiopulmonary disease.   Original Report Authenticated By: Tiburcio Pea   Ct Abdomen Pelvis W Contrast 12/17/2012   *RADIOLOGY REPORT*  Clinical Data: 32 year old female with abdominal and pelvic pain, nausea and vomiting.  CT ABDOMEN AND PELVIS WITH CONTRAST  Technique:  Multidetector CT imaging of the abdomen and pelvis was performed following the standard protocol during bolus administration of intravenous contrast.  Contrast: 100 ml intravenous Omnipaque-300  Comparison: 05/17/2005 CT  Findings: The liver, adrenal glands, kidneys, pancreas and gallbladder are unremarkable. Splenomegaly is again identified with a splenic volume of 625 CCs. No free fluid, enlarged lymph nodes, biliary dilation or abdominal aortic aneurysm identified. A few scattered colonic diverticula are noted without evidence of particulate this.  There are scattered bowel loops with equivocal mild wall thickening in the enteritis is not excluded. There is no evidence of bowel obstruction, pneumoperitoneum or abscess. The bladder and appendix are unremarkable.  A 2.7 cm right adnexal/ovarian cyst is present. The uterus and adnexal regions are otherwise unremarkable.  No acute or suspicious bony abnormalities are identified.  IMPRESSION: Equivocal mild small bowel wall thickening which could represent an enteritis.  Unchanged splenomegaly.  2.7 cm right adnexal/ovarian cyst without adjacent free fluid.   Original Report Authenticated By: Harmon Pier, M.D.    Marcelle.Calk:  Pt with fever to 102.7 while in the ED. APAP and IVF given with improvement. VS otherwise remain stable. No vomiting or stooling while in the ED. Pt with significant leukocytosis, but not actively vomiting at the time of labs draw. Will  start IV cipro and flagyl for enteritis on CT scan. Dx and testing d/w pt and family.  Questions answered.  Verb understanding, agreeable to observation admit. T/C to [facility] Dr. Sharl Ma, case discussed, including:  HPI, pertinent PM/SHx, VS/PE, dx testing, ED course and treatment:  Agreeable to observation admit, requests to write temporary orders, obtain medical bed.      Laray Anger, DO 12/19/12 1247

## 2012-12-17 ENCOUNTER — Encounter (HOSPITAL_COMMUNITY): Payer: Self-pay | Admitting: *Deleted

## 2012-12-17 DIAGNOSIS — K529 Noninfective gastroenteritis and colitis, unspecified: Secondary | ICD-10-CM

## 2012-12-17 DIAGNOSIS — K5289 Other specified noninfective gastroenteritis and colitis: Principal | ICD-10-CM

## 2012-12-17 DIAGNOSIS — R509 Fever, unspecified: Secondary | ICD-10-CM

## 2012-12-17 DIAGNOSIS — D72829 Elevated white blood cell count, unspecified: Secondary | ICD-10-CM

## 2012-12-17 DIAGNOSIS — E860 Dehydration: Secondary | ICD-10-CM

## 2012-12-17 LAB — COMPREHENSIVE METABOLIC PANEL
Albumin: 2.8 g/dL — ABNORMAL LOW (ref 3.5–5.2)
Alkaline Phosphatase: 72 U/L (ref 39–117)
BUN: 13 mg/dL (ref 6–23)
CO2: 24 mEq/L (ref 19–32)
Chloride: 103 mEq/L (ref 96–112)
GFR calc non Af Amer: 88 mL/min — ABNORMAL LOW (ref >90)
Potassium: 3.7 mEq/L (ref 3.5–5.1)
Total Bilirubin: 0.5 mg/dL (ref 0.3–1.2)

## 2012-12-17 LAB — CBC
HCT: 38 % (ref 36.0–46.0)
Hemoglobin: 13.4 g/dL (ref 12.0–15.0)
RBC: 4.43 MIL/uL (ref 3.87–5.11)
RDW: 13.9 % (ref 11.5–15.5)
WBC: 10.2 10*3/uL (ref 4.0–10.5)

## 2012-12-17 MED ORDER — CIPROFLOXACIN IN D5W 400 MG/200ML IV SOLN
400.0000 mg | Freq: Two times a day (BID) | INTRAVENOUS | Status: DC
  Administered 2012-12-17: 400 mg via INTRAVENOUS
  Filled 2012-12-17 (×3): qty 200

## 2012-12-17 MED ORDER — SODIUM CHLORIDE 0.9 % IV SOLN: 250.0000 mL | INTRAVENOUS | Status: DC | PRN

## 2012-12-17 MED ORDER — METRONIDAZOLE IN NACL 5-0.79 MG/ML-% IV SOLN: 500.0000 mg | Freq: Once | INTRAVENOUS | Status: DC

## 2012-12-17 MED ORDER — SODIUM CHLORIDE 0.9 % IV SOLN
INTRAVENOUS | Status: DC
  Administered 2012-12-17: 1000 mL via INTRAVENOUS

## 2012-12-17 MED ORDER — CIPROFLOXACIN HCL 500 MG PO TABS: 500.0000 mg | ORAL_TABLET | Freq: Two times a day (BID) | ORAL | Status: DC

## 2012-12-17 MED ORDER — METRONIDAZOLE IN NACL 5-0.79 MG/ML-% IV SOLN
INTRAVENOUS | Status: AC
  Filled 2012-12-17: qty 100

## 2012-12-17 MED ORDER — SODIUM CHLORIDE 0.9 % IJ SOLN: 3.0000 mL | INTRAMUSCULAR | Status: DC | PRN

## 2012-12-17 MED ORDER — METRONIDAZOLE 500 MG PO TABS: 500.0000 mg | ORAL_TABLET | Freq: Three times a day (TID) | ORAL | Status: DC

## 2012-12-17 MED ORDER — CIPROFLOXACIN IN D5W 400 MG/200ML IV SOLN
400.0000 mg | Freq: Once | INTRAVENOUS | Status: AC
  Administered 2012-12-17: 400 mg via INTRAVENOUS
  Filled 2012-12-17: qty 200

## 2012-12-17 MED ORDER — SODIUM CHLORIDE 0.9 % IJ SOLN: 3.0000 mL | Freq: Two times a day (BID) | INTRAMUSCULAR | Status: DC

## 2012-12-17 MED ORDER — HYDROMORPHONE HCL PF 1 MG/ML IJ SOLN: 0.5000 mg | INTRAMUSCULAR | Status: DC | PRN

## 2012-12-17 MED ORDER — SODIUM CHLORIDE 0.9 % IV SOLN: INTRAVENOUS | Status: DC

## 2012-12-17 MED ORDER — METRONIDAZOLE IN NACL 5-0.79 MG/ML-% IV SOLN
500.0000 mg | Freq: Three times a day (TID) | INTRAVENOUS | Status: DC
  Administered 2012-12-17 (×2): 500 mg via INTRAVENOUS
  Filled 2012-12-17 (×5): qty 100

## 2012-12-17 MED ORDER — ONDANSETRON HCL 4 MG/2ML IJ SOLN: 4.0000 mg | Freq: Three times a day (TID) | INTRAMUSCULAR | Status: AC | PRN

## 2012-12-17 MED ORDER — HYDROCODONE-ACETAMINOPHEN 5-325 MG PO TABS: 1.0000 | ORAL_TABLET | Freq: Four times a day (QID) | ORAL | Status: DC | PRN

## 2012-12-17 MED ORDER — ENOXAPARIN SODIUM 40 MG/0.4ML ~~LOC~~ SOLN
40.0000 mg | SUBCUTANEOUS | Status: DC
  Administered 2012-12-17: 40 mg via SUBCUTANEOUS
  Filled 2012-12-17: qty 0.4

## 2012-12-17 MED ORDER — ONDANSETRON HCL 4 MG/2ML IJ SOLN: 4.0000 mg | Freq: Four times a day (QID) | INTRAMUSCULAR | Status: DC | PRN

## 2012-12-17 NOTE — H&P (Signed)
PCP:   Redmond Baseman, MD   Chief Complaint:  Nausea vomiting   HPI: 32 year old female with no significant past medical history came to the ED today with one day history of nausea vomiting and diarrhea which started this morning. Patient also complained of abdominal pain which has now resolved. She denies any fever but was found to have temp of 102.7 in the ED, she denies chest pain no shortness of breath no headache. No dysuria urgency or frequency of urination. Patient is having her menses at this time.  Allergies:  No Known Allergies   History reviewed. No pertinent past medical history.  Past Surgical History  Procedure Laterality Date  . Throat surgery    . Cystoscopy  2010    removed from thyroid    Prior to Admission medications   Not on File    Social History:  reports that she has been smoking Cigarettes.  She has a 5 pack-year smoking history. She does not have any smokeless tobacco history on file. She reports that  drinks alcohol. She reports that she does not use illicit drugs.  Patient's sister had lung cancer  All the positives are listed in BOLD  Review of Systems:  HEENT: Headache, blurred vision, runny nose, sore throat Neck: Hypothyroidism, hyperthyroidism,,lymphadenopathy Chest : Shortness of breath, history of COPD, Asthma Heart : Chest pain, history of coronary arterey disease GI:  Nausea, vomiting, diarrhea, constipation, GERD GU: Dysuria, urgency, frequency of urination, hematuria Neuro: Stroke, seizures, syncope Psych: Depression, anxiety, hallucinations   Physical Exam: Blood pressure 116/60, pulse 97, temperature 99.5 F (37.5 C), temperature source Oral, resp. rate 20, height 5\' 6"  (1.676 m), weight 114.051 kg (251 lb 7 oz), last menstrual period 12/16/2012, SpO2 96.00%. Constitutional:   Patient is a well-developed and well-nourished female* in no acute distress and cooperative with exam. Head: Normocephalic and atraumatic Mouth:  Mucus membranes moist Eyes: PERRL, EOMI, conjunctivae normal Neck: Supple, No Thyromegaly Cardiovascular: RRR, S1 normal, S2 normal Pulmonary/Chest: CTAB, no wheezes, rales, or rhonchi Abdominal: Soft. Non-tender, non-distended, bowel sounds are normal, no masses, organomegaly, or guarding present.  Neurological: A&O x3, Strenght is normal and symmetric bilaterally, cranial nerve II-XII are grossly intact, no focal motor deficit, sensory intact to light touch bilaterally.  Extremities : No Cyanosis, Clubbing or Edema   Labs on Admission:  Results for orders placed during the hospital encounter of 12/16/12 (from the past 48 hour(s))  CBC WITH DIFFERENTIAL     Status: Abnormal   Collection Time    12/16/12  8:24 PM      Result Value Range   WBC 22.6 (*) 4.0 - 10.5 K/uL   RBC 5.16 (*) 3.87 - 5.11 MIL/uL   Hemoglobin 15.6 (*) 12.0 - 15.0 g/dL   HCT 16.1  09.6 - 04.5 %   MCV 86.8  78.0 - 100.0 fL   MCH 30.2  26.0 - 34.0 pg   MCHC 34.8  30.0 - 36.0 g/dL   RDW 40.9  81.1 - 91.4 %   Platelets 289  150 - 400 K/uL   Neutrophils Relative % 91 (*) 43 - 77 %   Neutro Abs 20.5 (*) 1.7 - 7.7 K/uL   Lymphocytes Relative 2 (*) 12 - 46 %   Lymphs Abs 0.5 (*) 0.7 - 4.0 K/uL   Monocytes Relative 7  3 - 12 %   Monocytes Absolute 1.6 (*) 0.1 - 1.0 K/uL   Eosinophils Relative 0  0 - 5 %   Eosinophils  Absolute 0.1  0.0 - 0.7 K/uL   Basophils Relative 0  0 - 1 %   Basophils Absolute 0.0  0.0 - 0.1 K/uL  COMPREHENSIVE METABOLIC PANEL     Status: Abnormal   Collection Time    12/16/12  8:24 PM      Result Value Range   Sodium 139  135 - 145 mEq/L   Potassium 4.0  3.5 - 5.1 mEq/L   Chloride 99  96 - 112 mEq/L   CO2 26  19 - 32 mEq/L   Glucose, Bld 112 (*) 70 - 99 mg/dL   BUN 15  6 - 23 mg/dL   Creatinine, Ser 6.44  0.50 - 1.10 mg/dL   Calcium 9.5  8.4 - 03.4 mg/dL   Total Protein 8.3  6.0 - 8.3 g/dL   Albumin 3.7  3.5 - 5.2 g/dL   AST 16  0 - 37 U/L   ALT 10  0 - 35 U/L   Alkaline Phosphatase  96  39 - 117 U/L   Total Bilirubin 0.4  0.3 - 1.2 mg/dL   GFR calc non Af Amer 70 (*) >90 mL/min   GFR calc Af Amer 81 (*) >90 mL/min   Comment:            The eGFR has been calculated     using the CKD EPI equation.     This calculation has not been     validated in all clinical     situations.     eGFR's persistently     <90 mL/min signify     possible Chronic Kidney Disease.  LIPASE, BLOOD     Status: None   Collection Time    12/16/12  8:24 PM      Result Value Range   Lipase 24  11 - 59 U/L  PREGNANCY, URINE     Status: None   Collection Time    12/16/12 10:13 PM      Result Value Range   Preg Test, Ur NEGATIVE  NEGATIVE   Comment:            THE SENSITIVITY OF THIS     METHODOLOGY IS >20 mIU/mL.  URINALYSIS, ROUTINE W REFLEX MICROSCOPIC     Status: Abnormal   Collection Time    12/16/12 10:13 PM      Result Value Range   Color, Urine BROWN (*) YELLOW   Comment: BIOCHEMICALS MAY BE AFFECTED BY COLOR   APPearance TURBID (*) CLEAR   Specific Gravity, Urine >1.030 (*) 1.005 - 1.030   pH 5.5  5.0 - 8.0   Glucose, UA NEGATIVE  NEGATIVE mg/dL   Hgb urine dipstick LARGE (*) NEGATIVE   Bilirubin Urine NEGATIVE  NEGATIVE   Ketones, ur NEGATIVE  NEGATIVE mg/dL   Protein, ur TRACE (*) NEGATIVE mg/dL   Urobilinogen, UA 0.2  0.0 - 1.0 mg/dL   Nitrite NEGATIVE  NEGATIVE   Leukocytes, UA NEGATIVE  NEGATIVE  URINE MICROSCOPIC-ADD ON     Status: Abnormal   Collection Time    12/16/12 10:13 PM      Result Value Range   WBC, UA 0-2  <3 WBC/hpf   RBC / HPF TOO NUMEROUS TO COUNT  <3 RBC/hpf   Bacteria, UA FEW (*) RARE    Radiological Exams on Admission: Dg Chest 2 View  12/16/2012   *RADIOLOGY REPORT*  Clinical Data: Abdominal pain and nausea.  CHEST - 2 VIEW  Comparison: None.  Findings: No  significant osseous abnormality.  Lungs are clear. No effusion or pneumothorax.  Cardiomediastinal size and contour are within normal limits.  No pneumoperitoneum.  Enteric contrast in the  stomach.  IMPRESSION: No evidence of acute cardiopulmonary disease.   Original Report Authenticated By: Tiburcio Pea   Ct Abdomen Pelvis W Contrast  12/17/2012   *RADIOLOGY REPORT*  Clinical Data: 32 year old female with abdominal and pelvic pain, nausea and vomiting.  CT ABDOMEN AND PELVIS WITH CONTRAST  Technique:  Multidetector CT imaging of the abdomen and pelvis was performed following the standard protocol during bolus administration of intravenous contrast.  Contrast: 100 ml intravenous Omnipaque-300  Comparison: 05/17/2005 CT  Findings: The liver, adrenal glands, kidneys, pancreas and gallbladder are unremarkable. Splenomegaly is again identified with a splenic volume of 625 CCs. No free fluid, enlarged lymph nodes, biliary dilation or abdominal aortic aneurysm identified. A few scattered colonic diverticula are noted without evidence of particulate this.  There are scattered bowel loops with equivocal mild wall thickening in the enteritis is not excluded. There is no evidence of bowel obstruction, pneumoperitoneum or abscess. The bladder and appendix are unremarkable.  A 2.7 cm right adnexal/ovarian cyst is present. The uterus and adnexal regions are otherwise unremarkable.  No acute or suspicious bony abnormalities are identified.  IMPRESSION: Equivocal mild small bowel wall thickening which could represent an enteritis.  Unchanged splenomegaly.  2.7 cm right adnexal/ovarian cyst without adjacent free fluid.   Original Report Authenticated By: Harmon Pier, M.D.    Assessment/Plan Active Problems:   Enteritis  CT scan of the abdomen showed small ball wall thickening, which could represent enteritis Clinically patient seems to have the same so she was started on Cipro and Flagyl Will start on clear liquid diet IV hydration with normal saline at 125 mL per hour  Nausea vomiting Secondary to above Zofran when necessary for nausea vomiting  DVT prophylaxis Lovenox  Code status: Presumed  full code  Family discussion: Discussed with patient in detail   Time Spent on Admission: 65 min  Hareem Surowiec S [facility] Pager: [phone] 12/17/2012, 1:31 AM  If 7PM-7AM, please contact night-coverage  [email or web address]  Password [email or web address]

## 2012-12-17 NOTE — Progress Notes (Signed)
D/c plan for today was that if pt felt ready to go, we would d/c her, assuming that she could eat her lunch and tolerate it ok, if not she would be d/c tomorrow. Pt ate a good portion of her lunch and tolerated it very well. She did state that she had some abd pain, but no N/V.  Pt ambulates herself to the bathroom, I asked multiple times today how many BMs that she had, and how the BMs she had today compared to the diarrhea she had on admission. Pt states that she has continued to have multiple BMs, but they are much smaller than previously.  Myself and Dr. Kerry Hough spoke with the patient about d/c plan, and told her to call us when she knew if she felt prepared to go today. I asked pt a couple of times if she knew what she wanted to do, but she was still unsure because she couldn't get in touch with family. At 1540, pt called out and stated that she wanted to take a shower. I told her I would come wrap her IV so that she could do so. At approximately 1600 while gathering supplies to wrap IV, another RN came to me and said that pt was cursing and stating she was leaving AMA. I immediately went to patients room, where she was very verbally abusive. She was very upset, removed her own IV at tossed it at me, I stated that she did not need to throw things at me. I asked her if I could get her paperwork for her and then allow her to leave, she agreed. Dr. Kerry Hough was notified, and he wants her to definitely take her prescriptions. I printed D/C AVS and went into patients room to discuss d/c & new prescriptions. AC remained in doorway at this time. I reviewed prescriptions and d/c paperwork with patient. Pt refused to sign paperwork, but did take her prescriptions with her, and escorted herself off of the floor. AC verified that pt had left the hospital. Pt did state that she had a ride when she was leaving. Sheryn Bison

## 2012-12-17 NOTE — Plan of Care (Signed)
Problem: Phase III Progression Outcomes Goal: IV/normal saline lock discontinued Outcome: Completed/Met Date Met:  12/17/12 Pt d/c her own IV, but IV was intact. Sheryn Bison

## 2012-12-17 NOTE — ED Notes (Signed)
Feels much better. Tolerating ice chips well

## 2012-12-17 NOTE — Discharge Summary (Signed)
Physician Discharge Summary  ONESTI BONFIGLIO ZOX:[id number] DOB: 05-10-1981 DOA: 12/16/2012  PCP: Redmond Baseman, MD  Admit date: 12/16/2012 Discharge date: 12/17/2012  Time spent: 40 minutes  Recommendations for Outpatient Follow-up:  1. Followup with primary care physician in one to 2 weeks  Discharge Diagnoses:  Active Problems:   Enteritis  dehydration Leukocytosis Nausea and vomiting Diarrhea  Discharge Condition: Improving  Diet recommendation: Low fiber  Filed Weights   12/16/12 1952 12/17/12 0219  Weight: 114.051 kg (251 lb 7 oz) 116.166 kg (256 lb 1.6 oz)    History of present illness:  32 year old female with no significant past medical history came to the ED today with one day history of nausea vomiting and diarrhea which started this morning. Patient also complained of abdominal pain which has now resolved. She denies any fever but was found to have temp of 102.7 in the ED, she denies chest pain no shortness of breath no headache. No dysuria urgency or frequency of urination. Patient is having her menses at this time.   Hospital Course:  The patient was admitted to the hospital with diarrhea, vomiting, dehydration due to enteritis. She was found to have high fevers on admission of 102.7. CT indicated a possible mild enteritis. She was aggressively hydrated with IV fluids and given IV antibiotics. Diet was slowly advanced. Currently, she is tolerating solid food. Her nausea and vomiting has resolved. She's been afebrile and. Leukocytosis is also resolved and remainder of lab work is unremarkable. She reports that her diarrhea is significantly improving. Stool volume has significantly decreased and stools are becoming more formed. Since she is able to tolerate by mouth antibiotics, patient was offered discharge home. She has elected to leave the hospital today to continue treatment as an outpatient. She has been given prescriptions for ciprofloxacin and  Flagyl.  Procedures:  None  Consultations:  None  Discharge Exam: Filed Vitals:   12/17/12 0023 12/17/12 0219 12/17/12 0617 12/17/12 1527  BP: 116/60 116/71 107/72 119/67  Pulse: 97 91 79 69  Temp: 99.5 F (37.5 C) 97.8 F (36.6 C) 99.2 F (37.3 C) 98.1 F (36.7 C)  TempSrc: Oral Axillary Oral Oral  Resp: 20 20 20 20   Height:      Weight:  116.166 kg (256 lb 1.6 oz)    SpO2: 96% 95% 98% 100%    General: No acute distress Cardiovascular: S1, S2, regular rate and rhythm Respiratory: Soft, nontender, obese, positive bowel sounds  Discharge Instructions  Discharge Orders   Future Orders Complete By Expires     Call MD for:  persistant dizziness or light-headedness  As directed     Call MD for:  persistant nausea and vomiting  As directed     Call MD for:  severe uncontrolled pain  As directed     Call MD for:  temperature >100.4  As directed     Diet - low sodium heart healthy  As directed     Increase activity slowly  As directed         Medication List         ciprofloxacin 500 MG tablet  Commonly known as:  CIPRO  Take 1 tablet (500 mg total) by mouth 2 (two) times daily.     HYDROcodone-acetaminophen 5-325 MG per tablet  Commonly known as:  NORCO  Take 1 tablet by mouth every 6 (six) hours as needed for pain.     metroNIDAZOLE 500 MG tablet  Commonly known as:  FLAGYL  Take 1 tablet (500 mg total) by mouth 3 (three) times daily.       No Known Allergies     Follow-up Information   Follow up with Redmond Baseman, MD. Schedule an appointment as soon as possible for a visit in 1 week.   Contact information:   [address] [place] [address] [phone]        The results of significant diagnostics from this hospitalization (including imaging, microbiology, ancillary and laboratory) are listed below for reference.    Significant Diagnostic Studies: Dg Chest 2 View  12/16/2012   *RADIOLOGY REPORT*  Clinical Data: Abdominal pain  and nausea.  CHEST - 2 VIEW  Comparison: None.  Findings: No significant osseous abnormality.  Lungs are clear. No effusion or pneumothorax.  Cardiomediastinal size and contour are within normal limits.  No pneumoperitoneum.  Enteric contrast in the stomach.  IMPRESSION: No evidence of acute cardiopulmonary disease.   Original Report Authenticated By: Tiburcio Pea   Ct Abdomen Pelvis W Contrast  12/17/2012   *RADIOLOGY REPORT*  Clinical Data: 32 year old female with abdominal and pelvic pain, nausea and vomiting.  CT ABDOMEN AND PELVIS WITH CONTRAST  Technique:  Multidetector CT imaging of the abdomen and pelvis was performed following the standard protocol during bolus administration of intravenous contrast.  Contrast: 100 ml intravenous Omnipaque-300  Comparison: 05/17/2005 CT  Findings: The liver, adrenal glands, kidneys, pancreas and gallbladder are unremarkable. Splenomegaly is again identified with a splenic volume of 625 CCs. No free fluid, enlarged lymph nodes, biliary dilation or abdominal aortic aneurysm identified. A few scattered colonic diverticula are noted without evidence of particulate this.  There are scattered bowel loops with equivocal mild wall thickening in the enteritis is not excluded. There is no evidence of bowel obstruction, pneumoperitoneum or abscess. The bladder and appendix are unremarkable.  A 2.7 cm right adnexal/ovarian cyst is present. The uterus and adnexal regions are otherwise unremarkable.  No acute or suspicious bony abnormalities are identified.  IMPRESSION: Equivocal mild small bowel wall thickening which could represent an enteritis.  Unchanged splenomegaly.  2.7 cm right adnexal/ovarian cyst without adjacent free fluid.   Original Report Authenticated By: Harmon Pier, M.D.    Microbiology: No results found for this or any previous visit (from the past 240 hour(s)).   Labs: Basic Metabolic Panel:  Recent Labs Lab 12/16/12 2024 12/17/12 0642  NA 139 137  K  4.0 3.7  CL 99 103  CO2 26 24  GLUCOSE 112* 108*  BUN 15 13  CREATININE 1.04 0.86  CALCIUM 9.5 8.3*   Liver Function Tests:  Recent Labs Lab 12/16/12 2024 12/17/12 0642  AST 16 12  ALT 10 7  ALKPHOS 96 72  BILITOT 0.4 0.5  PROT 8.3 6.7  ALBUMIN 3.7 2.8*    Recent Labs Lab 12/16/12 2024  LIPASE 24   No results found for this basename: AMMONIA,  in the last 168 hours CBC:  Recent Labs Lab 12/16/12 2024 12/17/12 0642  WBC 22.6* 10.2  NEUTROABS 20.5*  --   HGB 15.6* 13.4  HCT 44.8 38.0  MCV 86.8 85.8  PLT 289 234   Cardiac Enzymes: No results found for this basename: CKTOTAL, CKMB, CKMBINDEX, TROPONINI,  in the last 168 hours BNP: BNP (last 3 results) No results found for this basename: PROBNP,  in the last 8760 hours CBG: No results found for this basename: GLUCAP,  in the last 168 hours     Signed:  MEMON,JEHANZEB  [facility] 12/17/2012, 5:15 PM

## 2012-12-18 NOTE — Progress Notes (Signed)
UR chart review completed.  

## 2014-10-06 ENCOUNTER — Emergency Department (HOSPITAL_COMMUNITY)
Admission: EM | Admit: 2014-10-06 | Discharge: 2014-10-07 | Disposition: A | Discharge status: Home / Self Care | Payer: Medicaid Other | Attending: Emergency Medicine | Admitting: Emergency Medicine

## 2014-10-06 ENCOUNTER — Encounter (HOSPITAL_COMMUNITY): Payer: Self-pay | Admitting: *Deleted

## 2014-10-06 DIAGNOSIS — Z720 Tobacco use: Secondary | ICD-10-CM | POA: Insufficient documentation

## 2014-10-06 DIAGNOSIS — R110 Nausea: Secondary | ICD-10-CM | POA: Insufficient documentation

## 2014-10-06 DIAGNOSIS — A599 Trichomoniasis, unspecified: Secondary | ICD-10-CM | POA: Insufficient documentation

## 2014-10-06 DIAGNOSIS — Z792 Long term (current) use of antibiotics: Secondary | ICD-10-CM | POA: Diagnosis not present

## 2014-10-06 DIAGNOSIS — R109 Unspecified abdominal pain: Secondary | ICD-10-CM | POA: Diagnosis not present

## 2014-10-06 DIAGNOSIS — Z3202 Encounter for pregnancy test, result negative: Secondary | ICD-10-CM | POA: Diagnosis not present

## 2014-10-06 LAB — COMPREHENSIVE METABOLIC PANEL
ALK PHOS: 71 U/L (ref 38–126)
ALT: 10 U/L — ABNORMAL LOW (ref 14–54)
ANION GAP: 8 (ref 5–15)
AST: 14 U/L — ABNORMAL LOW (ref 15–41)
Albumin: 2.9 g/dL — ABNORMAL LOW (ref 3.5–5.0)
BILIRUBIN TOTAL: 0.3 mg/dL (ref 0.3–1.2)
BUN: 10 mg/dL (ref 6–20)
CALCIUM: 9.2 mg/dL (ref 8.9–10.3)
CHLORIDE: 104 mmol/L (ref 101–111)
CO2: 26 mmol/L (ref 22–32)
CREATININE: 0.99 mg/dL (ref 0.44–1.00)
GFR calc Af Amer: >60 mL/min (ref >60)
GFR calc non Af Amer: >60 mL/min (ref >60)
GLUCOSE: 91 mg/dL (ref 65–99)
POTASSIUM: 3.8 mmol/L (ref 3.5–5.1)
SODIUM: 138 mmol/L (ref 135–145)
Total Protein: 7.1 g/dL (ref 6.5–8.1)

## 2014-10-06 LAB — CBC WITH DIFFERENTIAL/PLATELET
BASOS ABS: 0 10*3/uL (ref 0.0–0.1)
BASOS PCT: 0 % (ref 0–1)
EOS PCT: 1 % (ref 0–5)
Eosinophils Absolute: 0.1 10*3/uL (ref 0.0–0.7)
HCT: 38.2 % (ref 36.0–46.0)
HEMOGLOBIN: 13.1 g/dL (ref 12.0–15.0)
LYMPHS PCT: 23 % (ref 12–46)
Lymphs Abs: 2.7 10*3/uL (ref 0.7–4.0)
MCH: 29.6 pg (ref 26.0–34.0)
MCHC: 34.3 g/dL (ref 30.0–36.0)
MCV: 86.4 fL (ref 78.0–100.0)
MONO ABS: 0.7 10*3/uL (ref 0.1–1.0)
Monocytes Relative: 6 % (ref 3–12)
Neutro Abs: 8.6 10*3/uL — ABNORMAL HIGH (ref 1.7–7.7)
Neutrophils Relative %: 70 % (ref 43–77)
PLATELETS: 325 10*3/uL (ref 150–400)
RBC: 4.42 MIL/uL (ref 3.87–5.11)
RDW: 13.4 % (ref 11.5–15.5)
WBC: 12.2 10*3/uL — ABNORMAL HIGH (ref 4.0–10.5)

## 2014-10-06 LAB — URINALYSIS, ROUTINE W REFLEX MICROSCOPIC
BILIRUBIN URINE: NEGATIVE
GLUCOSE, UA: NEGATIVE mg/dL
Hgb urine dipstick: NEGATIVE
KETONES UR: NEGATIVE mg/dL
NITRITE: NEGATIVE
PROTEIN: NEGATIVE mg/dL
SPECIFIC GRAVITY, URINE: 1.019 (ref 1.005–1.030)
Urobilinogen, UA: 1 mg/dL (ref 0.0–1.0)
pH: 7.5 (ref 5.0–8.0)

## 2014-10-06 LAB — URINE MICROSCOPIC-ADD ON

## 2014-10-06 LAB — LIPASE, BLOOD: Lipase: 19 U/L — ABNORMAL LOW (ref 22–51)

## 2014-10-06 LAB — POC URINE PREG, ED: PREG TEST UR: NEGATIVE

## 2014-10-06 MED ORDER — PROCHLORPERAZINE EDISYLATE 5 MG/ML IJ SOLN
10.0000 mg | INTRAMUSCULAR | Status: AC
  Administered 2014-10-06: 10 mg via INTRAVENOUS
  Filled 2014-10-06: qty 2

## 2014-10-06 MED ORDER — KETOROLAC TROMETHAMINE 30 MG/ML IJ SOLN
30.0000 mg | Freq: Once | INTRAMUSCULAR | Status: AC
  Administered 2014-10-06: 30 mg via INTRAVENOUS
  Filled 2014-10-06: qty 1

## 2014-10-06 MED ORDER — NAPROXEN 500 MG PO TABS: 500.0000 mg | ORAL_TABLET | Freq: Two times a day (BID) | ORAL | Status: DC

## 2014-10-06 MED ORDER — PROCHLORPERAZINE EDISYLATE 5 MG/ML IJ SOLN: 10.0000 mg | Freq: Four times a day (QID) | INTRAMUSCULAR | Status: DC | PRN

## 2014-10-06 MED ORDER — SODIUM CHLORIDE 0.9 % IV BOLUS (SEPSIS)
1000.0000 mL | INTRAVENOUS | Status: AC
  Administered 2014-10-06: 1000 mL via INTRAVENOUS

## 2014-10-06 MED ORDER — METRONIDAZOLE 500 MG PO TABS
2000.0000 mg | ORAL_TABLET | Freq: Once | ORAL | Status: AC
  Administered 2014-10-06: 2000 mg via ORAL
  Filled 2014-10-06: qty 4

## 2014-10-06 MED ORDER — DIPHENHYDRAMINE HCL 50 MG/ML IJ SOLN
25.0000 mg | Freq: Once | INTRAMUSCULAR | Status: AC
  Administered 2014-10-06: 25 mg via INTRAVENOUS
  Filled 2014-10-06: qty 1

## 2014-10-06 NOTE — ED Notes (Signed)
Pt in c/o lower left flank pain for the last few months, worse near time of menstrual cycle, states she didn't have her cycle this month, pain worse tonight, reports nausea, no distress noted

## 2014-10-06 NOTE — Discharge Instructions (Signed)
Please follow the directions provided.  Be sure to use the resources to follow-up with a primary care provider.  Please take the Naproxen twice a day to help with this pain.  An infection calledTrichomoniasis was seen in your urine today.  Usually it is accompanied by vaginal discharge and discomfort.  You were treated for the infection in the emergency department. Your partner will need to be treated also, otherwise you will be exposed again.  Don't hesitate to return for any new, worsening or concerning symptoms.     SEEK IMMEDIATE MEDICAL CARE IF:  Your pain is not controlled with medicine.  You have new or worsening symptoms.  Your pain increases.  You have abdominal pain.  You have shortness of breath.  You have persistent nausea or vomiting.  You have swelling in your abdomen.  You feel faint or pass out.  You have blood in your urine.  You have a fever or persistent symptoms for more than 2-3 days.  You have a fever and your symptoms suddenly get worse.    Emergency Department Resource Guide 1) Find a Doctor and Pay Out of Pocket Although you won't have to find out who is covered by your insurance plan, it is a good idea to ask around and get recommendations. You will then need to call the office and see if the doctor you have chosen will accept you as a new patient and what types of options they offer for patients who are self-pay. Some doctors offer discounts or will set up payment plans for their patients who do not have insurance, but you will need to ask so you aren't surprised when you get to your appointment.  2) Contact Your Local Health Department Not all health departments have doctors that can see patients for sick visits, but many do, so it is worth a call to see if yours does. If you don't know where your local health department is, you can check in your phone book. The CDC also has a tool to help you locate your state's health department, and many state websites also have  listings of all of their local health departments.  3) Find a Walk-in Clinic If your illness is not likely to be very severe or complicated, you may want to try a walk in clinic. These are popping up all over the country in pharmacies, drugstores, and shopping centers. They're usually staffed by nurse practitioners or physician assistants that have been trained to treat common illnesses and complaints. They're usually fairly quick and inexpensive. However, if you have serious medical issues or chronic medical problems, these are probably not your best option.  No Primary Care Doctor: - Call Health Connect at  [phone] - they can help you locate a primary care doctor that  accepts your insurance, provides certain services, etc. - Physician Referral Service- [phone]  Chronic Pain Problems: Organization         Address  Phone   Notes  [facility]  [phone] Patients need to be referred by their primary care doctor.   Medication Assistance: Organization         Address  Phone   Notes  [facility] Medication [facility] [address] [place], [place] [address] [phone] --Must be a resident of [facility] -- Must have NO insurance coverage whatsoever (no Medicaid/ Medicare, etc.) -- The pt. MUST have a primary care doctor that directs their care regularly and follows them in  the community   [facility]ist  [phone]) [phone]   [address]  [phone]    Agencies that provide inexpensive medical care: Organization         Address  Phone   Notes  [facility]  [phone]) [phone]   [facility]    [phone]   [facility] [address] [place], [place] [address] [phone]) [phone]   [facility] [address], [place] [phone]) [phone]   [facility]anned Parenthood    [phone]) [phone]   [facility]    [phone]   [facility]  [address], [place] Phone:  [phone], Fax:  [phone] Hours of Operation:  9 am - 6 pm, M-F.  Also accepts Medicaid/Medicare and self-pay.  [facility]  [address], [place] Phone: [phone], Fax: [phone]) [phone]. Hours of Operation:  8:30 am - 5:30 pm, M-F.  Also accepts Medicaid and self-pay.  [facility] [place] [address], [place] Phone: [phone](336) [phone]   [facility] [address], [place], [place] [phone], Ext. [phone] Mondays & Thursdays: 7-9 AM.  First 15 patients are seen on a first come, first serve basis.    Medicaid-accepting [facility] Providers:  Organization         Address  Phone   Notes  [facility] [address], [place] [phone] Also accepts self-pay patients.  [facility] [address], [place]  [phone]   [facility] [address], [place] [phone]) [phone]   [facility] [address] [place] [address], [place] [phone]) [phone]   Renaye RakersVeita Bland [address], [place]   [phone]) [phone] Only accepts [place] Access [place] patients after they have their name applied to their card.   Self-Pay (no insurance) in [facility]:  Organization         Address  Phone   Notes  Sickle Cell Patients, [facility] [address], [place] [phone]) [phone]   [facility] [address], [place] [phone]) [phone]   [facility] [place]  [address], [place] [phone]   [facility]/Dr. Osei-Bonsu  [address] [place] [address], [place] or [phone], [place] [phone] Phone number for both [place] and [place] locations is the same.  [facility] [address], [place] [phone]) [phone]   [facility] [place] [address] [place] [address],  [place] or [address] [phone](336) [phone]   [facility] [address], [place] [phone]) [phone], phone; [phone], fax Sees patients 1st and 3rd Saturday of every month.  Must not qualify for public or private insurance (i.e. Medicaid, Medicare, [place] Health Choice, Veterans' Benefits)  Household income should be no more than 200% of the poverty level The clinic cannot treat you if you are pregnant or think you are pregnant  Sexually transmitted diseases are not treated at the clinic.    Dental Care: Organization         Address  Phone  Notes  [facility] [address], [place] [phone] Accepts children up to age 34 who are enrolled in [place]  or [place] Health Choice; pregnant women with a Medicaid card; and children who have applied for Medicaid or [place] Health Choice, but were declined, whose parents can pay a reduced fee at time of service.  [place] [facility]  [address], [place] [phone] Accepts children up to age 71 who are enrolled in [place] or [place] Health Choice; pregnant women with a Medicaid card; and children who have applied for Medicaid or [place] Health Choice, but were declined, whose parents can pay a reduced fee at time of service.  [facility]  [address] [place], [place] [phone] Patients are seen by appointment only. Walk-ins are not accepted. [facility] will see patients 83 years of age and older. Monday - Tuesday (8am-5pm) Most Wednesdays (8:30-5pm) $30 per visit, cash only  [facility] Access PRO[facility]  [address], [facility] [phone] Patients are seen by appointment only. Walk-ins are not accepted. [facility] will see patients 78 years of age and older. One Wednesday Evening (Monthly: Volunteer Based).  $30 per visit, cash only  [employer]  [phone] for adults; Children under age 54, call Graduate Pediatric Dentistry at [phone]. Children aged 24-14, please call [phone] to request a pediatric application.  Dental services are provided in all areas of dental care including fillings, crowns and bridges, complete and partial dentures, implants, gum treatment, root canals, and extractions. Preventive care is also provided. Treatment is provided to both adults and children. Patients are selected via a lottery and there is often a waiting list.   [facility] [address], [place]  [phone] [email or web address]   [facility] [address] [place], [place] [phone], Ext. [phone] Second and Fourth Thursday of each month, opens at 6:30 AM; Clinic ends at 9 AM.  Patients are seen on a first-come first-served basis, and a limited number are seen during each clinic.   [facility]  [address] [place], [place] [phone]   Eligibility Requirements You must have lived in [place], [place], or [place] counties for at least the last three months.   You cannot be eligible for state or federal sponsored [place], including [employer], [place], or [employer].   You generally cannot be eligible for healthcare insurance through your employer.    How to apply: Eligibility screenings are held every Tuesday and Wednesday afternoon from 1:00 pm until 4:00 pm. You do not need an appointment for the interview!  [facility] [address] [place], [place] [phone]   [facility] Health Department  [phone]   [facility] Health Department  [phone]   [facility] Health Department  [phone]    Behavioral Health Resources in the Community: Intensive Outpatient Programs Organization         Address  Phone  Notes  [facility] Services [address] [place], [place] [phone]     [facility]patient [address] [place], [place] [phone]   [facility] [address], [place], [place]  [phone]   [facility] Mental Health [address]  [place], [place] [phone] or [phone]   Substance Abuse Resources Organization         Address  Phone  Notes  Alcohol [facility] Drug Services  [phone]   [facility]  [phone]   [employer]  [employer]  7858707996   Floydene Flock  [phone]   Residential & Outpatient Substance Abuse Program  [phone]   Psychological Services Organization         Address  Phone  Notes  [facility] Behavioral Health  [phone]   [facility]  [phone]   [facility] Mental Health [address] [place] [phone] or [phone]    Mobile Crisis Teams Organization         Address  Phone  Notes  [facility], Mobile Crisis Care Unit  [phone]   [facility]  [address] [place], [place] [phone]   Doristine Locks [address] [place] [phone]    Self-Help/Support Groups Organization         Address  Phone             Notes  [facility] [place] - variety of support groups  [phone]- I7437963 Call for more information  Narcotics Anonymous (NA), [facility] [address], [employer] [place]  2 meetings at this location   Statistician         Address  Phone  Notes  [facility] [address],    [place]  [phone]   [facility]  [address], [place] [address], [place], [place] [phone]   [facility] [address] [place], [place] [phone] Admissions: 8am-3pm M-F  [facility] [address],    [place], [place] [phone]   [facility] [address] [place], [place], [place] [phone]   [facility] [address]  [place],  [place] [phone]   [facility] - Intensive Outpatient [address], [place], [place] [phone]   [facility] ([facility] [address] [place]  [place], [place] [phone] or [phone]   [facility] ([facility]) [address], [place], [place] [phone] Accepts Medicaid  [facility] [address],  [place] [phone] Substance Abuse/Addiction Treatment   [facility] Organization         Address  Phone  Notes  [facility]  ([phone]) [phone]   Angie Fava, PhD [address] [place], [place]   [phone] or [phone]   [facility] Behavioral   [address] [place], [place] [phone]   [facility] [address] [place], [place] [phone] Insurance/Medicaid/sponsorship through [employer] and Families [address]                                    [place], [place] [phone] Therapy/tele-psych/case  [facility] [address], [place] [phone]    Dr. Lolly Mustache  [phone]   [facility]  [employer] [facility] Dept. 1) [address] [place] 2) [address], [place] 3)  [address], [place] [phone]  7868885623   [facility] Child Abuse Hotline (916)350-7239 or (206)192-2122 (After Hours)

## 2014-10-06 NOTE — ED Provider Notes (Signed)
CSN: [id number]     Arrival date & time 10/06/14  2134 History   First MD Initiated Contact with Patient 10/06/14 2149     Chief Complaint  Patient presents with  . Flank Pain   (Consider location/radiation/quality/duration/timing/severity/associated sxs/prior Treatment) HPI  Cristina Hicks is a 34 yo female presenting with report of left flank pain x several months.  She first noticed the pain in February, a few days before her menstrual period.  After her period ended the pain subsided also.  This occurred again in March and April.  In the month of May the pain began about 10 days before her expected period but she has not had her period yet this month.  The pain has continued for 2 weeks this episode.  She notices it is worse when she pushes on her left flank.  She reports some nausea with the pain but denies any vomiting, diarrhea or constipation, vaginal symptoms, dysuria, fevers, or chills.  History reviewed. No pertinent past medical history. Past Surgical History  Procedure Laterality Date  . Throat surgery    . Cystoscopy  2010    removed from thyroid   Family History  Problem Relation Age of Onset  . Diabetes Father   . Lung cancer Sister    History  Substance Use Topics  . Smoking status: Current Every Day Smoker -- 1.00 packs/day for 5 years    Types: Cigarettes  . Smokeless tobacco: Not on file  . Alcohol Use: Yes     Comment: occasional   OB History    No data available     Review of Systems  Constitutional: Negative for fever and chills.  HENT: Negative for sore throat.   Eyes: Negative for visual disturbance.  Respiratory: Negative for cough and shortness of breath.   Cardiovascular: Negative for chest pain and leg swelling.  Gastrointestinal: Positive for nausea. Negative for vomiting, abdominal pain and diarrhea.  Genitourinary: Positive for flank pain. Negative for dysuria, vaginal bleeding, vaginal discharge, vaginal pain and pelvic pain.    Musculoskeletal: Negative for myalgias.  Skin: Negative for rash.  Neurological: Negative for weakness, numbness and headaches.      Allergies  Review of patient's allergies indicates no known allergies.  Home Medications   Prior to Admission medications   Medication Sig Start Date End Date Taking? Authorizing Provider  ciprofloxacin (CIPRO) 500 MG tablet Take 1 tablet (500 mg total) by mouth 2 (two) times daily. 12/17/12   Erick BlinksJehanzeb Memon, MD  HYDROcodone-acetaminophen (NORCO) 5-325 MG per tablet Take 1 tablet by mouth every 6 (six) hours as needed for pain. 12/17/12   Erick BlinksJehanzeb Memon, MD  metroNIDAZOLE (FLAGYL) 500 MG tablet Take 1 tablet (500 mg total) by mouth 3 (three) times daily. 12/17/12   Erick BlinksJehanzeb Memon, MD   BP 123/71 mmHg  Pulse 81  Temp(Src) 98 F (36.7 C) (Oral)  Resp 20  Wt 285 lb (129.275 kg)  SpO2 100%  LMP 08/30/2014 Physical Exam  Constitutional: She is oriented to person, place, and time. She appears well-developed and well-nourished. No distress.  HENT:  Head: Normocephalic and atraumatic.  Mouth/Throat: Oropharynx is clear and moist.  Eyes: Conjunctivae are normal.  Neck: Neck supple.  Cardiovascular: Normal rate, regular rhythm and intact distal pulses.   Pulmonary/Chest: Effort normal and breath sounds normal. No respiratory distress. She has no wheezes. She has no rales. She exhibits no tenderness.  Abdominal: Soft. Bowel sounds are normal. She exhibits no distension and no mass. There is  no hepatosplenomegaly. There is tenderness ( Left flank pain). There is no rigidity, no rebound, no guarding, no CVA tenderness, no tenderness at McBurney's point and negative Murphy's sign.    Musculoskeletal: She exhibits no tenderness.  Lymphadenopathy:    She has no cervical adenopathy.  Neurological: She is alert and oriented to person, place, and time. No cranial nerve deficit.  Skin: Skin is warm and dry. No rash noted. She is not diaphoretic.  Psychiatric: She has  a normal mood and affect.  Nursing note and vitals reviewed.   ED Course  Procedures (including critical care time) Labs Review Labs Reviewed  CBC WITH DIFFERENTIAL/PLATELET - Abnormal; Notable for the following:    WBC 12.2 (*)    Neutro Abs 8.6 (*)    All other components within normal limits  COMPREHENSIVE METABOLIC PANEL - Abnormal; Notable for the following:    Albumin 2.9 (*)    AST 14 (*)    ALT 10 (*)    All other components within normal limits  LIPASE, BLOOD - Abnormal; Notable for the following:    Lipase 19 (*)    All other components within normal limits  URINALYSIS, ROUTINE W REFLEX MICROSCOPIC - Abnormal; Notable for the following:    APPearance CLOUDY (*)    Leukocytes, UA SMALL (*)    All other components within normal limits  URINE MICROSCOPIC-ADD ON - Abnormal; Notable for the following:    Squamous Epithelial / LPF FEW (*)    Bacteria, UA FEW (*)    All other components within normal limits  POC URINE PREG, ED    Imaging Review No results found.   EKG Interpretation None      MDM   Final diagnoses:  Trichimoniasis  Left flank pain   34 yo with ongoing and intermittent left flank pain for several months.  Patient is nontoxic, nonseptic appearing, in no apparent distress.  Based on presentation, doubt acute abdominal process. Discussed case with Dr. Denton Lank.  Screening labs and urine reviewed.  Urine shows small leukocytes and 7-10 wbc and trichomonas present.  WBC mild elevated to 12.2.  Pt denies vaginal or urinary symptoms but will treat for trichomoniasis. Patient's pain and other symptoms adequately managed in emergency department.  Fluid bolus given.  Pain resolved after treatment. On repeat exam, no indication of peritonitis. No indication of appendicitis, bowel obstruction, bowel perforation, cholecystitis, diverticulitis, PID or ectopic pregnancy.  Pt is well-appearing, in no acute distress and vital signs reviewed and not concerning. She appears  safe to be discharged. Prescription for NSAIDs provided.  Discharge include follow-up with their PCP.  Return precautions provided. Pt aware of plan and in agreement.   Filed Vitals:   10/06/14 2138  BP: 123/71  Pulse: 81  Temp: 98 F (36.7 C)  TempSrc: Oral  Resp: 20  Weight: 285 lb (129.275 kg)  SpO2: 100%   Meds given in ED:  Medications  sodium chloride 0.9 % bolus 1,000 mL (0 mLs Intravenous Stopped 10/07/14 0010)  ketorolac (TORADOL) 30 MG/ML injection 30 mg (30 mg Intravenous Given 10/06/14 2228)  diphenhydrAMINE (BENADRYL) injection 25 mg (25 mg Intravenous Given 10/06/14 2228)  prochlorperazine (COMPAZINE) injection 10 mg (10 mg Intravenous Given 10/06/14 2228)  metroNIDAZOLE (FLAGYL) tablet 2,000 mg (2,000 mg Oral Given 10/06/14 2345)    Discharge Medication List as of 10/06/2014 11:50 PM    START taking these medications   Details  naproxen (NAPROSYN) 500 MG tablet Take 1 tablet (500 mg total) by  mouth 2 (two) times daily., Starting 10/06/2014, Until Discontinued, Print           Harle Battiest, NP 10/07/14 9147  Cathren Laine, MD 10/07/14 8295

## 2017-05-27 ENCOUNTER — Emergency Department (HOSPITAL_COMMUNITY)
Admission: EM | Admit: 2017-05-27 | Discharge: 2017-05-27 | Disposition: A | Discharge status: Home / Self Care | Payer: Medicaid Other | Attending: Emergency Medicine | Admitting: Emergency Medicine

## 2017-05-27 ENCOUNTER — Encounter (HOSPITAL_COMMUNITY): Payer: Self-pay | Admitting: Emergency Medicine

## 2017-05-27 ENCOUNTER — Other Ambulatory Visit: Payer: Self-pay

## 2017-05-27 DIAGNOSIS — Z3202 Encounter for pregnancy test, result negative: Secondary | ICD-10-CM | POA: Diagnosis not present

## 2017-05-27 DIAGNOSIS — R110 Nausea: Secondary | ICD-10-CM | POA: Diagnosis not present

## 2017-05-27 DIAGNOSIS — K6289 Other specified diseases of anus and rectum: Secondary | ICD-10-CM | POA: Diagnosis present

## 2017-05-27 DIAGNOSIS — K641 Second degree hemorrhoids: Secondary | ICD-10-CM | POA: Insufficient documentation

## 2017-05-27 DIAGNOSIS — F17210 Nicotine dependence, cigarettes, uncomplicated: Secondary | ICD-10-CM | POA: Diagnosis not present

## 2017-05-27 DIAGNOSIS — Z79899 Other long term (current) drug therapy: Secondary | ICD-10-CM | POA: Insufficient documentation

## 2017-05-27 LAB — URINALYSIS, ROUTINE W REFLEX MICROSCOPIC
Bacteria, UA: NONE SEEN
Bilirubin Urine: NEGATIVE
Glucose, UA: NEGATIVE mg/dL
Hgb urine dipstick: NEGATIVE
KETONES UR: NEGATIVE mg/dL
Nitrite: NEGATIVE
PH: 6 (ref 5.0–8.0)
Protein, ur: NEGATIVE mg/dL
SPECIFIC GRAVITY, URINE: 1.012 (ref 1.005–1.030)

## 2017-05-27 LAB — PREGNANCY, URINE: Preg Test, Ur: NEGATIVE

## 2017-05-27 MED ORDER — FLUCONAZOLE 200 MG PO TABS: 200.0000 mg | ORAL_TABLET | Freq: Every day | ORAL | 0 refills | Status: AC

## 2017-05-27 MED ORDER — HYDROCORTISONE 1 % EX CREA: TOPICAL_CREAM | CUTANEOUS | 0 refills | Status: DC

## 2017-05-27 NOTE — ED Triage Notes (Signed)
Pt presents with multiple complaints since last night   Complains of reoccurring yeast infections  L flank pain   Congestion with no cough  Dr Lysbeth GalasNyland is PCP

## 2017-05-27 NOTE — ED Provider Notes (Signed)
[facility] EMERGENCY DEPARTMENT Provider Note   CSN: [id number] Arrival date & time: 05/27/17  1739     History   Chief Complaint Chief Complaint  Patient presents with  . multiple complaints    HPI Cristina Hicks is a 37 y.o. female.  HPI  Patient presents due to nausea, hemorrhoids, rectal pain. This episode of rectal pain began yesterday. Initially the patient denies any history of hemorrhoids, but after repeat exam, and physical exam she states that she has had emergent pregnancy, though they have been uncomplicated and she has had none recently. She notes that over the past 2 months she has had a recurrent yeast infection, has had inconsistent menstrual cycle, and now over the past day has had rectal pain. No medication taken for her hemorrhoids. She previously used if looking for a yeast infection. Otherwise no recent medication changes, diet changes, activity changes. She states that she only has hemorrhage when she is pregnant, and is concerned about this possibility.  History reviewed. No pertinent past medical history.  Patient Active Problem List   Diagnosis Date Noted  . Enteritis 12/17/2012    Past Surgical History:  Procedure Laterality Date  . CYSTOSCOPY  2010   removed from thyroid  . THROAT SURGERY      OB History    No data available       Home Medications    Prior to Admission medications   Medication Sig Start Date End Date Taking? Authorizing Provider  Multiple Vitamins-Minerals ([employer] SPECTRAVITE PO) Take 1 tablet by mouth daily.   Yes [provider]  hydrocortisone cream 1 % Apply to affected area 2 times daily 05/27/17   Gerhard Munch, MD    Family History Family History  Problem Relation Age of Onset  . Diabetes Father   . Lung cancer Sister     Social History Social History   Tobacco Use  . Smoking status: Current Every Day Smoker    Packs/day: 0.50    Years: 5.00    Pack years: 2.50    Types: Cigarettes    . Smokeless tobacco: Current User  Substance Use Topics  . Alcohol use: Yes    Comment: occasional  . Drug use: No     Allergies   Patient has no known allergies.   Review of Systems Review of Systems  Constitutional:       Per HPI, otherwise negative  HENT:       Per HPI, otherwise negative  Respiratory:       Per HPI, otherwise negative  Cardiovascular:       Per HPI, otherwise negative  Gastrointestinal: Negative for vomiting.  Endocrine:       Negative aside from HPI  Genitourinary:       Neg aside from HPI   Musculoskeletal:       Per HPI, otherwise negative  Skin: Negative.   Neurological: Negative for syncope.     Physical Exam Updated Vital Signs BP 118/76 (BP Location: Right Arm)   Pulse 78   Temp 98 F (36.7 C) (Oral)   Resp (!) 21   Ht 5\' 5"  (1.651 m)   Wt 130.6 kg (288 lb)   LMP 04/11/2017 Comment: needs preg test  SpO2 99%   BMI 47.93 kg/m   Physical Exam  Constitutional: She is oriented to person, place, and time. She appears well-developed and well-nourished. No distress.  Obese young female sitting upright speaking clearly awake and alert  HENT:  Head:  Normocephalic and atraumatic.  Eyes: Conjunctivae and EOM are normal.  Cardiovascular: Normal rate and regular rhythm.  Pulmonary/Chest: Effort normal and breath sounds normal. No stridor. No respiratory distress.  Abdominal: She exhibits no distension.  Genitourinary:     Musculoskeletal: She exhibits no edema.  Neurological: She is alert and oriented to person, place, and time. No cranial nerve deficit.  Skin: Skin is warm and dry.  Psychiatric: She has a normal mood and affect.  Nursing note and vitals reviewed.    ED Treatments / Results  Labs (all labs ordered are listed, but only abnormal results are displayed) Labs Reviewed  URINALYSIS, ROUTINE W REFLEX MICROSCOPIC - Abnormal; Notable for the following components:      Result Value   Leukocytes, UA TRACE (*)     Squamous Epithelial / LPF 0-5 (*)    All other components within normal limits  PREGNANCY, URINE     Procedures Procedures (including critical care time)  Medications Ordered in ED Medications - No data to display   Initial Impression / Assessment and Plan / ED Course  I have reviewed the triage vital signs and the nursing notes.  Pertinent labs & imaging results that were available during my care of the patient were reviewed by me and considered in my medical decision making (see chart for details).  On repeat exam the patient is in no distress. Given her description of yeast infection, and concern for hemorrhoid patient will receive both Preparation H and Diflucan. Patient encouraged to follow-up with primary care and general surgery. Absent evidence for thrombosis or complicated hemorrhoid, as well as with otherwise reassuring vitals, urinalysis, patient appropriate for discharge with close outpatient follow-up.  Final Clinical Impressions(s) / ED Diagnoses   Final diagnoses:  Grade II hemorrhoids    ED Discharge Orders        Ordered    hydrocortisone cream 1 %     05/27/17 1924       Gerhard MunchLockwood, Cesario Weidinger, MD 05/27/17 1926

## 2018-04-23 ENCOUNTER — Emergency Department (HOSPITAL_COMMUNITY)
Admission: EM | Admit: 2018-04-23 | Discharge: 2018-04-23 | Disposition: A | Discharge status: Home / Self Care | Payer: Medicaid Other | Attending: Emergency Medicine | Admitting: Emergency Medicine

## 2018-04-23 ENCOUNTER — Other Ambulatory Visit: Payer: Self-pay

## 2018-04-23 ENCOUNTER — Emergency Department (HOSPITAL_COMMUNITY): Payer: Medicaid Other

## 2018-04-23 ENCOUNTER — Encounter (HOSPITAL_COMMUNITY): Payer: Self-pay | Admitting: Emergency Medicine

## 2018-04-23 DIAGNOSIS — R339 Retention of urine, unspecified: Secondary | ICD-10-CM | POA: Diagnosis not present

## 2018-04-23 DIAGNOSIS — K5900 Constipation, unspecified: Secondary | ICD-10-CM | POA: Insufficient documentation

## 2018-04-23 DIAGNOSIS — F17210 Nicotine dependence, cigarettes, uncomplicated: Secondary | ICD-10-CM | POA: Insufficient documentation

## 2018-04-23 DIAGNOSIS — R39198 Other difficulties with micturition: Secondary | ICD-10-CM

## 2018-04-23 LAB — CBC
HCT: 38.6 % (ref 36.0–46.0)
HEMOGLOBIN: 12.4 g/dL (ref 12.0–15.0)
MCH: 28.2 pg (ref 26.0–34.0)
MCHC: 32.1 g/dL (ref 30.0–36.0)
MCV: 87.9 fL (ref 80.0–100.0)
Platelets: 282 10*3/uL (ref 150–400)
RBC: 4.39 MIL/uL (ref 3.87–5.11)
RDW: 13.3 % (ref 11.5–15.5)
WBC: 8.9 10*3/uL (ref 4.0–10.5)
nRBC: 0 % (ref 0.0–0.2)

## 2018-04-23 LAB — URINALYSIS, ROUTINE W REFLEX MICROSCOPIC
Bilirubin Urine: NEGATIVE
Glucose, UA: NEGATIVE mg/dL
HGB URINE DIPSTICK: NEGATIVE
Ketones, ur: NEGATIVE mg/dL
Leukocytes, UA: NEGATIVE
Nitrite: NEGATIVE
Protein, ur: NEGATIVE mg/dL
Specific Gravity, Urine: 1.017 (ref 1.005–1.030)
pH: 5 (ref 5.0–8.0)

## 2018-04-23 LAB — PREGNANCY, URINE: Preg Test, Ur: NEGATIVE

## 2018-04-23 LAB — BASIC METABOLIC PANEL
ANION GAP: 5 (ref 5–15)
BUN: 8 mg/dL (ref 6–20)
CHLORIDE: 107 mmol/L (ref 98–111)
CO2: 27 mmol/L (ref 22–32)
Calcium: 8.7 mg/dL — ABNORMAL LOW (ref 8.9–10.3)
Creatinine, Ser: 0.91 mg/dL (ref 0.44–1.00)
GFR calc Af Amer: >60 mL/min (ref >60)
GFR calc non Af Amer: >60 mL/min (ref >60)
Glucose, Bld: 94 mg/dL (ref 70–99)
Potassium: 3.6 mmol/L (ref 3.5–5.1)
Sodium: 139 mmol/L (ref 135–145)

## 2018-04-23 MED ORDER — POLYETHYLENE GLYCOL 3350 17 G PO PACK: 17.0000 g | PACK | Freq: Every day | ORAL | 2 refills | Status: AC

## 2018-04-23 NOTE — ED Provider Notes (Addendum)
[facility] EMERGENCY DEPARTMENT Provider Note   CSN: [id number] Arrival date & time: 04/23/18  1407     History   Chief Complaint Chief Complaint  Patient presents with  . Urinary Retention  . Constipation    HPI Cristina Hicks is a 37 y.o. female.  Patient presenting with 2 separate complaints.  One is urinary retention with dysuria.  Also constipation.  Patient does have a known past history of hemorrhoids has not had any follow-up since they were first mentioned clinically in January 2019.  Patient denies any nausea or vomiting.  Patient states that she is not making much urine and not peeing much urine for the past 2 days.  Patient states there is been some light red blood in her stool.  No nausea vomiting or diarrhea no abdominal pain no fevers.  Patient does have a primary care doctor.     History reviewed. No pertinent past medical history.  Patient Active Problem List   Diagnosis Date Noted  . Enteritis 12/17/2012    Past Surgical History:  Procedure Laterality Date  . ADENOIDECTOMY    . CESAREAN SECTION    . CYSTOSCOPY  2010   removed from thyroid  . THROAT SURGERY    . TONSILLECTOMY       OB History    Gravida  1   Para  1   Term  1   Preterm      AB      Living  1     SAB      TAB      Ectopic      Multiple      Live Births               Home Medications    Prior to Admission medications   Medication Sig Start Date End Date Taking? Authorizing Provider  polyethylene glycol (MIRALAX) packet Take 17 g by mouth daily. 04/23/18   Vanetta Mulders, MD    Family History Family History  Problem Relation Age of Onset  . Diabetes Father   . Lung cancer Sister     Social History Social History   Tobacco Use  . Smoking status: Current Every Day Smoker    Packs/day: 0.50    Years: 5.00    Pack years: 2.50    Types: Cigarettes  . Smokeless tobacco: Never Used  Substance Use Topics  . Alcohol use: Yes    Comment:  occasional  . Drug use: No     Allergies   Patient has no known allergies.   Review of Systems Review of Systems  Constitutional: Negative for fever.  HENT: Negative for congestion.   Eyes: Negative for redness.  Respiratory: Negative for shortness of breath.   Cardiovascular: Negative for chest pain.  Gastrointestinal: Positive for blood in stool and constipation. Negative for abdominal pain, diarrhea, nausea and vomiting.  Genitourinary: Positive for difficulty urinating and dysuria. Negative for hematuria.  Musculoskeletal: Negative for back pain.  Skin: Negative for rash.  Neurological: Negative for headaches.  Hematological: Does not bruise/bleed easily.  Psychiatric/Behavioral: Negative for confusion.     Physical Exam Updated Vital Signs BP (!) 116/96 (BP Location: Right Arm)   Pulse 63   Temp (!) 97.5 F (36.4 C) (Oral)   Resp 16   Ht 1.651 m (5\' 5" )   Wt 127 kg   LMP 03/26/2018   SpO2 99%   BMI 46.59 kg/m   Physical Exam  Constitutional: She is oriented  to person, place, and time. She appears well-developed and well-nourished. No distress.  HENT:  Head: Normocephalic and atraumatic.  Mouth/Throat: Oropharynx is clear and moist.  Eyes: Pupils are equal, round, and reactive to light. Conjunctivae and EOM are normal.  Neck: Normal range of motion. Neck supple.  Cardiovascular: Normal rate and regular rhythm.  Pulmonary/Chest: Effort normal and breath sounds normal. No respiratory distress.  Abdominal: Soft. Bowel sounds are normal. There is no tenderness.  Musculoskeletal: Normal range of motion.  Neurological: She is alert and oriented to person, place, and time. No cranial nerve deficit or sensory deficit. She exhibits normal muscle tone. Coordination normal.  Skin: Skin is warm. No rash noted.  Nursing note and vitals reviewed.    ED Treatments / Results  Labs (all labs ordered are listed, but only abnormal results are displayed) Labs Reviewed    BASIC METABOLIC PANEL - Abnormal; Notable for the following components:      Result Value   Calcium 8.7 (*)    All other components within normal limits  URINALYSIS, ROUTINE W REFLEX MICROSCOPIC  CBC  PREGNANCY, URINE    EKG None  Radiology Dg Abd Acute W/chest  Result Date: 04/23/2018 CLINICAL DATA:  Constipation and urinary retention. EXAM: DG ABDOMEN ACUTE W/ 1V CHEST COMPARISON:  CT scan 03/07/2017 FINDINGS: The upright chest x-ray demonstrates normal cardiomediastinal contours. The lungs are clear. No pleural effusion. The bony thorax is intact. Two views of the abdomen demonstrate scattered air and stool throughout the colon and down into the rectum and scattered small bowel dose with air but no distension or air-fluid levels. No free air. The soft tissue shadows of the abdomen are maintained. No worrisome calcifications. IMPRESSION: 1. No acute cardiopulmonary findings. 2. No plain film findings for an acute abdominal process. Electronically Signed   By: Rudie Meyer M.D.   On: 04/23/2018 17:30    Procedures Procedures (including critical care time)  Medications Ordered in ED Medications - No data to display   Initial Impression / Assessment and Plan / ED Course  I have reviewed the triage vital signs and the nursing notes.  Pertinent labs & imaging results that were available during my care of the patient were reviewed by me and considered in my medical decision making (see chart for details).    Patient bladder scan showed only 100 cc of urine in the bladder.  Patient not able to void she did have a straight cath.  Urinalysis not consistent with urinary tract infection.  Abdomen was soft and nontender.  Patient had plain films of her abdomen and chest acute abdominal series without evidence of any bowel obstruction or any significant stool burden.  There was some increased stool.  Patient renal function was normal as well no explanation for why that she is having the  difficulty with urination.  Patient's bladder did is not over distended and she her renal function is normal.  Patient will follow-up with her primary care doctor.  Also given referral to GI for the history of the hemorrhoids and the constipation.  She will be started on MiraLAX.  Patient nontoxic no acute distress.   Final Clinical Impressions(s) / ED Diagnoses   Final diagnoses:  Difficulty urinating  Constipation, unspecified constipation type    ED Discharge Orders         Ordered    polyethylene glycol (MIRALAX) packet  Daily     04/23/18 1840           Kmya Placide,  Lorin PicketScott, MD 04/24/18 [id number]    Vanetta MuldersZackowski, Dashton Czerwinski, MD 04/24/18 [phone]

## 2018-04-23 NOTE — Discharge Instructions (Signed)
Work-up here today without any acute findings.  Renal function was normal.  No urinary retention.  X-rays of the abdomen without any significant constipation.  But there is a little bit of a stool burden.  Would recommend MiraLAX prescription provided.  Take the MiraLAX daily.  Follow-up with your doctor and/or GI doctor regarding the history of the hemorrhoids.

## 2018-04-23 NOTE — ED Triage Notes (Signed)
Patient c/o constipation and urinary retention with dysuria and hemorrhoids. Patient states last able to void yesterday-unable to give time frame. Last had BM Friday. Per patient intermittent light red blood in stool. Denies nausea, vomiting, diarrhea, abd pain, or fevers.

## 2018-04-23 NOTE — ED Notes (Signed)
Awaiting re-eval and dispo 

## 2018-04-23 NOTE — ED Notes (Signed)
Bladder scan 101ml

## 2018-04-23 NOTE — ED Notes (Signed)
Pt reports that it is painful to urinate and that she only goes a bit and feels it all does not come out.  She will attempt an specimen at this time

## 2018-04-23 NOTE — ED Notes (Signed)
Pt swiped signature card but it did not take

## 2018-06-13 ENCOUNTER — Encounter (HOSPITAL_COMMUNITY): Payer: Self-pay

## 2018-06-13 ENCOUNTER — Emergency Department (HOSPITAL_COMMUNITY)
Admission: EM | Admit: 2018-06-13 | Discharge: 2018-06-13 | Disposition: A | Discharge status: Home / Self Care | Payer: Medicaid Other | Attending: Emergency Medicine | Admitting: Emergency Medicine

## 2018-06-13 DIAGNOSIS — Z5321 Procedure and treatment not carried out due to patient leaving prior to being seen by health care provider: Secondary | ICD-10-CM | POA: Diagnosis not present

## 2018-06-13 DIAGNOSIS — R05 Cough: Secondary | ICD-10-CM | POA: Diagnosis not present

## 2018-06-13 DIAGNOSIS — R509 Fever, unspecified: Secondary | ICD-10-CM | POA: Insufficient documentation

## 2018-06-13 NOTE — ED Triage Notes (Signed)
Patient complains of cough, congestion, fever and body aches x 3-5 days. Taking otc meds with no relief

## 2018-06-13 NOTE — ED Notes (Signed)
Patient decided to leave, this EMT advised patient to stay.

## 2020-02-03 ENCOUNTER — Emergency Department (HOSPITAL_COMMUNITY): Payer: 59

## 2020-02-03 ENCOUNTER — Encounter (HOSPITAL_COMMUNITY): Payer: Self-pay

## 2020-02-03 ENCOUNTER — Other Ambulatory Visit: Payer: Self-pay

## 2020-02-03 DIAGNOSIS — F17210 Nicotine dependence, cigarettes, uncomplicated: Secondary | ICD-10-CM | POA: Insufficient documentation

## 2020-02-03 DIAGNOSIS — R0602 Shortness of breath: Secondary | ICD-10-CM | POA: Insufficient documentation

## 2020-02-03 DIAGNOSIS — R079 Chest pain, unspecified: Secondary | ICD-10-CM | POA: Diagnosis not present

## 2020-02-03 LAB — CBC
HCT: 33.9 % — ABNORMAL LOW (ref 36.0–46.0)
Hemoglobin: 11.1 g/dL — ABNORMAL LOW (ref 12.0–15.0)
MCH: 28.4 pg (ref 26.0–34.0)
MCHC: 32.7 g/dL (ref 30.0–36.0)
MCV: 86.7 fL (ref 80.0–100.0)
Platelets: 259 10*3/uL (ref 150–400)
RBC: 3.91 MIL/uL (ref 3.87–5.11)
RDW: 14 % (ref 11.5–15.5)
WBC: 8.7 10*3/uL (ref 4.0–10.5)
nRBC: 0 % (ref 0.0–0.2)

## 2020-02-03 LAB — BASIC METABOLIC PANEL
Anion gap: 10 (ref 5–15)
BUN: 10 mg/dL (ref 6–20)
CO2: 27 mmol/L (ref 22–32)
Calcium: 9 mg/dL (ref 8.9–10.3)
Chloride: 109 mmol/L (ref 98–111)
Creatinine, Ser: 0.9 mg/dL (ref 0.44–1.00)
GFR calc Af Amer: >60 mL/min (ref >60)
GFR calc non Af Amer: >60 mL/min (ref >60)
Glucose, Bld: 107 mg/dL — ABNORMAL HIGH (ref 70–99)
Potassium: 3.8 mmol/L (ref 3.5–5.1)
Sodium: 146 mmol/L — ABNORMAL HIGH (ref 135–145)

## 2020-02-03 LAB — TROPONIN I (HIGH SENSITIVITY): Troponin I (High Sensitivity): 4 ng/L (ref <18)

## 2020-02-03 LAB — I-STAT BETA HCG BLOOD, ED (NOT ORDERABLE): I-stat hCG, quantitative: <5 m[IU]/mL (ref <5)

## 2020-02-03 NOTE — ED Triage Notes (Signed)
Patient arrived with complaints of generalized chest pain that started a week ago. States it worsens with movement.

## 2020-02-04 ENCOUNTER — Emergency Department (HOSPITAL_COMMUNITY)
Admission: EM | Admit: 2020-02-04 | Discharge: 2020-02-04 | Disposition: A | Discharge status: Home / Self Care | Payer: 59 | Attending: Emergency Medicine | Admitting: Emergency Medicine

## 2020-02-04 DIAGNOSIS — R079 Chest pain, unspecified: Secondary | ICD-10-CM | POA: Diagnosis not present

## 2020-02-04 MED ORDER — NAPROXEN 500 MG PO TABS
500.0000 mg | ORAL_TABLET | Freq: Once | ORAL | Status: AC
  Administered 2020-02-04: 500 mg via ORAL
  Filled 2020-02-04: qty 1

## 2020-02-04 NOTE — Discharge Instructions (Signed)
You were evaluated in the Emergency Department and after careful evaluation, we did not find any emergent condition requiring admission or further testing in the hospital.  Your exam/testing today was overall reassuring.  Your symptoms seem to be due to chest wall pain or costochondritis.  The tingling sensations in your hands may be due to carpal tunnel.  We recommend anti-inflammatories as needed for discomfort and use of wrist braces at night to help with the wrist issues.  We recommend follow-up with your primary care doctor.  Please return to the Emergency Department if you experience any worsening of your condition.  Thank you for allowing [place] to be a part of your care.

## 2020-02-04 NOTE — ED Notes (Signed)
Pt discharged from this ED in stable condition at this time. All discharge instructions and follow up care reviewed with pt with no further questions at this time. Pt ambulatory with steady gait, clear speech.  

## 2020-02-09 NOTE — ED Provider Notes (Signed)
[facility]-EMERGENCY DEPT [facility] Emergency Department Provider Note MRN:  [id number]  Arrival date & time: 02/09/20     Chief Complaint   Chest Pain   History of Present Illness   Cristina Hicks is a 39 y.o. year-old female with no pertinent past medical history presenting to the ED with chief complaint of chest pain.  Location: Central chest Duration: 1 week Onset: Gradual Timing: Constant Description: Heaviness Severity: Mild Exacerbating/Alleviating Factors: None Associated Symptoms: Shortness of breath, tingling in the hands that is worse at night Pertinent Negatives: Denies fever, no cough, no abdominal pain   Review of Systems  A complete 10 system review of systems was obtained and all systems are negative except as noted in the HPI and PMH.   Patient's Health History   History reviewed. No pertinent past medical history.  Past Surgical History:  Procedure Laterality Date  . ADENOIDECTOMY    . CESAREAN SECTION    . CYSTOSCOPY  2010   removed from thyroid  . THROAT SURGERY    . TONSILLECTOMY      Family History  Problem Relation Age of Onset  . Diabetes Father   . Lung cancer Sister     Social History   Socioeconomic History  . Marital status: Single    Spouse name: Not on file  . Number of children: Not on file  . Years of education: Not on file  . Highest education level: Not on file  Occupational History  . Not on file  Tobacco Use  . Smoking status: Current Every Day Smoker    Packs/day: 0.50    Years: 5.00    Pack years: 2.50    Types: Cigarettes  . Smokeless tobacco: Never Used  Vaping Use  . Vaping Use: Never used  Substance and Sexual Activity  . Alcohol use: Yes    Comment: occasional  . Drug use: No  . Sexual activity: Yes    Birth control/protection: None  Other Topics Concern  . Not on file  Social History Narrative  . Not on file   Social Determinants of Health   Financial Resource Strain:   . Difficulty of Paying  Living Expenses: Not on file  Food Insecurity:   . Worried About Programme researcher, broadcasting/film/video in the Last Year: Not on file  . Ran Out of Food in the Last Year: Not on file  Transportation Needs:   . Lack of Transportation (Medical): Not on file  . Lack of Transportation (Non-Medical): Not on file  Physical Activity:   . Days of Exercise per Week: Not on file  . Minutes of Exercise per Session: Not on file  Stress:   . Feeling of Stress : Not on file  Social Connections:   . Frequency of Communication with Friends and Family: Not on file  . Frequency of Social Gatherings with Friends and Family: Not on file  . Attends Religious Services: Not on file  . Active Member of Clubs or Organizations: Not on file  . Attends Banker Meetings: Not on file  . Marital Status: Not on file  Intimate Partner Violence:   . Fear of Current or Ex-Partner: Not on file  . Emotionally Abused: Not on file  . Physically Abused: Not on file  . Sexually Abused: Not on file     Physical Exam   Vitals:   02/04/20 0600 02/04/20 0742  BP: (!) 123/95 132/87  Pulse: 69 72  Resp: (!) 26 20  Temp:  98.6 F (37 C)  SpO2: 97% 100%    CONSTITUTIONAL: Well-appearing, NAD NEURO:  Alert and oriented x 3, no focal deficits EYES:  eyes equal and reactive ENT/NECK:  no LAD, no JVD CARDIO: Regular rate, well-perfused, normal S1 and S2 PULM:  CTAB no wheezing or rhonchi GI/GU:  normal bowel sounds, non-distended, non-tender MSK/SPINE:  No gross deformities, no edema SKIN:  no rash, atraumatic PSYCH:  Appropriate speech and behavior  *Additional and/or pertinent findings included in MDM below  Diagnostic and Interventional Summary    EKG Interpretation  Date/Time:  Sunday February 03 2020 22:29:20 EDT Ventricular Rate:  69 PR Interval:    QRS Duration: 95 QT Interval:  387 QTC Calculation: 415 R Axis:   -64 Text Interpretation: Sinus rhythm Markedly posterior QRS axis 12 Lead; Mason-Likar No  significant change was found Confirmed by Kennis Carina [phone]) on 02/04/2020 6:55:16 AM      Labs Reviewed  BASIC METABOLIC PANEL - Abnormal; Notable for the following components:      Result Value   Sodium 146 (*)    Glucose, Bld 107 (*)    All other components within normal limits  CBC - Abnormal; Notable for the following components:   Hemoglobin 11.1 (*)    HCT 33.9 (*)    All other components within normal limits  I-STAT BETA HCG BLOOD, ED ([facility], [facility], [facility] ONLY)  I-STAT BETA HCG BLOOD, ED (NOT ORDERABLE)  TROPONIN I (HIGH SENSITIVITY)    DG Chest 2 View  Final Result      Medications  naproxen (NAPROSYN) tablet 500 mg (500 mg Oral Given 02/04/20 0724)     Procedures  /  Critical Care Procedures  ED Course and Medical Decision Making  I have reviewed the triage vital signs, the nursing notes, and pertinent available records from the EMR.  Listed above are laboratory and imaging tests that I personally ordered, reviewed, and interpreted and then considered in my medical decision making (see below for details).  Reproducible pain on exam with palpation, minimal cardiovascular risk factors, EKG reassuring, doubt cardiac etiology.  PERC negative.  Appropriate for discharge with reassurance.       Elmer Sow. Pilar Plate, MD [facility] Emergency Medicine [facility] [email or web address]  Final Clinical Impressions(s) / ED Diagnoses     ICD-10-CM   1. Chest pain, unspecified type  R07.9     ED Discharge Orders    None       Discharge Instructions Discussed with and Provided to Patient:     Discharge Instructions     You were evaluated in the Emergency Department and after careful evaluation, we did not find any emergent condition requiring admission or further testing in the hospital.  Your exam/testing today was overall reassuring.  Your symptoms seem to be due to chest wall pain or costochondritis.  The tingling sensations in your hands may be due to  carpal tunnel.  We recommend anti-inflammatories as needed for discomfort and use of wrist braces at night to help with the wrist issues.  We recommend follow-up with your primary care doctor.  Please return to the Emergency Department if you experience any worsening of your condition.  Thank you for allowing [place] to be a part of your care.       Sabas Sous, MD 02/09/20 [phone]

## 2021-03-08 ENCOUNTER — Encounter (HOSPITAL_COMMUNITY): Payer: Self-pay

## 2021-03-08 ENCOUNTER — Emergency Department (HOSPITAL_COMMUNITY): Payer: 59

## 2021-03-08 ENCOUNTER — Emergency Department (HOSPITAL_COMMUNITY)
Admission: EM | Admit: 2021-03-08 | Discharge: 2021-03-08 | Disposition: A | Discharge status: Home / Self Care | Payer: 59 | Attending: Emergency Medicine | Admitting: Emergency Medicine

## 2021-03-08 ENCOUNTER — Other Ambulatory Visit: Payer: Self-pay

## 2021-03-08 DIAGNOSIS — R1032 Left lower quadrant pain: Secondary | ICD-10-CM | POA: Diagnosis present

## 2021-03-08 DIAGNOSIS — N390 Urinary tract infection, site not specified: Secondary | ICD-10-CM | POA: Diagnosis not present

## 2021-03-08 DIAGNOSIS — F17210 Nicotine dependence, cigarettes, uncomplicated: Secondary | ICD-10-CM | POA: Diagnosis not present

## 2021-03-08 DIAGNOSIS — E876 Hypokalemia: Secondary | ICD-10-CM | POA: Diagnosis not present

## 2021-03-08 DIAGNOSIS — R109 Unspecified abdominal pain: Secondary | ICD-10-CM

## 2021-03-08 LAB — CBC
HCT: 36.8 % (ref 36.0–46.0)
Hemoglobin: 12.5 g/dL (ref 12.0–15.0)
MCH: 28.6 pg (ref 26.0–34.0)
MCHC: 34 g/dL (ref 30.0–36.0)
MCV: 84.2 fL (ref 80.0–100.0)
Platelets: 297 10*3/uL (ref 150–400)
RBC: 4.37 MIL/uL (ref 3.87–5.11)
RDW: 12.7 % (ref 11.5–15.5)
WBC: 9.5 10*3/uL (ref 4.0–10.5)
nRBC: 0 % (ref 0.0–0.2)

## 2021-03-08 LAB — URINALYSIS, ROUTINE W REFLEX MICROSCOPIC
Bilirubin Urine: NEGATIVE
Glucose, UA: NEGATIVE mg/dL
Ketones, ur: 5 mg/dL — AB
Nitrite: POSITIVE — AB
Protein, ur: 30 mg/dL — AB
RBC / HPF: >50 RBC/hpf — ABNORMAL HIGH (ref 0–5)
Specific Gravity, Urine: 1.021 (ref 1.005–1.030)
pH: 5 (ref 5.0–8.0)

## 2021-03-08 LAB — COMPREHENSIVE METABOLIC PANEL
ALT: 9 U/L (ref 0–44)
AST: 17 U/L (ref 15–41)
Albumin: 3.4 g/dL — ABNORMAL LOW (ref 3.5–5.0)
Alkaline Phosphatase: 67 U/L (ref 38–126)
Anion gap: 7 (ref 5–15)
BUN: 7 mg/dL (ref 6–20)
CO2: 28 mmol/L (ref 22–32)
Calcium: 9 mg/dL (ref 8.9–10.3)
Chloride: 102 mmol/L (ref 98–111)
Creatinine, Ser: 0.88 mg/dL (ref 0.44–1.00)
GFR, Estimated: >60 mL/min (ref >60)
Glucose, Bld: 142 mg/dL — ABNORMAL HIGH (ref 70–99)
Potassium: 3 mmol/L — ABNORMAL LOW (ref 3.5–5.1)
Sodium: 137 mmol/L (ref 135–145)
Total Bilirubin: 0.4 mg/dL (ref 0.3–1.2)
Total Protein: 8 g/dL (ref 6.5–8.1)

## 2021-03-08 LAB — I-STAT BETA HCG BLOOD, ED (MC, WL, AP ONLY): I-stat hCG, quantitative: <5 m[IU]/mL (ref <5)

## 2021-03-08 LAB — LIPASE, BLOOD: Lipase: 27 U/L (ref 11–51)

## 2021-03-08 MED ORDER — CEPHALEXIN 500 MG PO CAPS: 500.0000 mg | ORAL_CAPSULE | Freq: Two times a day (BID) | ORAL | 0 refills | Status: AC

## 2021-03-08 MED ORDER — SODIUM CHLORIDE 0.9 % IV SOLN
2.0000 g | Freq: Once | INTRAVENOUS | Status: AC
  Administered 2021-03-08: 2 g via INTRAVENOUS
  Filled 2021-03-08: qty 20

## 2021-03-08 MED ORDER — IOHEXOL 350 MG/ML SOLN
80.0000 mL | Freq: Once | INTRAVENOUS | Status: AC | PRN
  Administered 2021-03-08: 80 mL via INTRAVENOUS

## 2021-03-08 MED ORDER — OXYCODONE-ACETAMINOPHEN 5-325 MG PO TABS
1.0000 | ORAL_TABLET | Freq: Once | ORAL | Status: AC
  Administered 2021-03-08: 1 via ORAL
  Filled 2021-03-08: qty 1

## 2021-03-08 MED ORDER — KETOROLAC TROMETHAMINE 15 MG/ML IJ SOLN: 30.0000 mg | Freq: Once | INTRAMUSCULAR | Status: DC

## 2021-03-08 MED ORDER — POTASSIUM CHLORIDE CRYS ER 20 MEQ PO TBCR
40.0000 meq | EXTENDED_RELEASE_TABLET | Freq: Once | ORAL | Status: AC
  Administered 2021-03-08: 40 meq via ORAL
  Filled 2021-03-08: qty 2

## 2021-03-08 MED ORDER — KETOROLAC TROMETHAMINE 15 MG/ML IJ SOLN
15.0000 mg | Freq: Once | INTRAMUSCULAR | Status: AC
  Administered 2021-03-08: 15 mg via INTRAVENOUS
  Filled 2021-03-08: qty 1

## 2021-03-08 MED ORDER — SODIUM CHLORIDE 0.9 % IV BOLUS
1000.0000 mL | Freq: Once | INTRAVENOUS | Status: AC
  Administered 2021-03-08: 1000 mL via INTRAVENOUS

## 2021-03-08 NOTE — ED Provider Notes (Signed)
Emergency Medicine Provider Triage Evaluation Note  Cristina Hicks , a 40 y.o. female  was evaluated in triage.  Pt complains of left lower quadrant abdominal pain since yesterday.  She has a history of dilated left fallopian tube and was supposed to have it removed surgically 1 month ago.  The surgery was ultimately aborted as the colon was attached to the fallopian tube and OB/GYN did not feel comfortable removing.  Her pain is severe in severity.  No fever, chills, nausea, vomiting, diarrhea.  No vaginal symptoms.  Review of Systems  Positive:  Negative: See above  Physical Exam  BP (!) 160/102 (BP Location: Left Arm)   Pulse 82   Temp 98.1 F (36.7 C) (Oral)   Resp 18   Ht 5\' 5"  (1.651 m)   Wt 115.7 kg   LMP 03/07/2021 (Approximate)   SpO2 95%   BMI 42.43 kg/m  Gen:   Awake, no distress   Resp:  Normal effort  MSK:   Moves extremities without difficulty  Other:  Left lower quadrant abdominal tenderness  Medical Decision Making  Medically screening exam initiated at 6:08 PM.  Appropriate orders placed.  03/09/2021 was informed that the remainder of the evaluation will be completed by another provider, this initial triage assessment does not replace that evaluation, and the importance of remaining in the ED until their evaluation is complete.     William Dalton, PA-C 03/08/21 1810    03/10/21, DO 03/08/21 2342

## 2021-03-08 NOTE — ED Triage Notes (Signed)
Patient states she has an infected L fallopian tube and an infected L ovary, states her sx tried to take her tubes out but they said her colon has attached to her tubes. Reports lower abd pain.

## 2021-03-08 NOTE — ED Provider Notes (Signed)
[facility]-EMERGENCY DEPT Provider Note   CSN: [id number] Arrival date & time: 03/08/21  1734     History Chief Complaint  Patient presents with   Abdominal Pain    Cristina Hicks is a 40 y.o. female.  This is a 40 y.o. female with significant medical history as below, including C-section who presents to the ED with complaint of left lower quad abdominal discomfort.  Patient reports intermittent abdominal discomfort over the past month.  She has been seen by gynecology and proctology regarding this complaint.   Location: Left lower quadrant Duration: 1 month Onset: Gradual Timing: Intermittent Description: Sharp, stabbing, aching Severity: Mild Exacerbating/Alleviating Factors: Worse with position changes or direct pressure.  Symptoms resolved after taking oral analgesics at home.   Associated Symptoms: None reported Pertinent Negatives: No fevers, chills, nausea, vomiting, change in bowel or bladder function.  No Concern for STI.   The history is provided by the patient. No language interpreter was used.  Abdominal Pain Pain location:  LLQ Pain quality: aching   Pain radiates to:  Does not radiate Pain severity:  Mild Onset quality:  Gradual Timing:  Intermittent Progression:  Waxing and waning Associated symptoms: no chest pain, no chills, no cough, no fever, no hematuria, no nausea, no shortness of breath and no vomiting       History reviewed. No pertinent past medical history.  Patient Active Problem List   Diagnosis Date Noted   Enteritis 12/17/2012    Past Surgical History:  Procedure Laterality Date   ADENOIDECTOMY     CESAREAN SECTION     CYSTOSCOPY  2010   removed from thyroid   THROAT SURGERY     TONSILLECTOMY       OB History     Gravida  1   Para  1   Term  1   Preterm      AB      Living  1      SAB      IAB      Ectopic      Multiple      Live Births              Family History  Problem  Relation Age of Onset   Diabetes Father    Lung cancer Sister     Social History   Tobacco Use   Smoking status: Every Day    Packs/day: 0.50    Years: 5.00    Pack years: 2.50    Types: Cigarettes   Smokeless tobacco: Never  Vaping Use   Vaping Use: Never used  Substance Use Topics   Alcohol use: Yes    Comment: occasional   Drug use: No    Home Medications Prior to Admission medications   Medication Sig Start Date End Date Taking? Authorizing Provider  cephALEXin (KEFLEX) 500 MG capsule Take 1 capsule (500 mg total) by mouth 2 (two) times daily for 10 days. 03/08/21 03/18/21 Yes Tanda Rockers A, DO  polyethylene glycol (MIRALAX) packet Take 17 g by mouth daily. 04/23/18   Vanetta Mulders, MD    Allergies    Tramadol  Review of Systems   Review of Systems  Constitutional:  Negative for chills and fever.  HENT:  Negative for facial swelling and trouble swallowing.   Eyes:  Negative for photophobia and visual disturbance.  Respiratory:  Negative for cough and shortness of breath.   Cardiovascular:  Negative for chest pain and palpitations.  Gastrointestinal:  Positive for abdominal pain. Negative for nausea and vomiting.  Endocrine: Negative for polydipsia and polyuria.  Genitourinary:  Negative for difficulty urinating and hematuria.  Musculoskeletal:  Negative for gait problem and joint swelling.  Skin:  Negative for pallor and rash.  Neurological:  Negative for syncope and headaches.  Psychiatric/Behavioral:  Negative for agitation and confusion.    Physical Exam Updated Vital Signs BP (!) 139/94 (BP Location: Right Arm)   Pulse 73   Temp 98.2 F (36.8 C) (Oral)   Resp 18   Ht 5\' 5"  (1.651 m)   Wt 115.7 kg   LMP 03/07/2021 (Approximate)   SpO2 100%   BMI 42.43 kg/m   Physical Exam Vitals and nursing note reviewed.  Constitutional:      General: She is not in acute distress.    Appearance: Normal appearance.  HENT:     Head: Normocephalic and  atraumatic.     Right Ear: External ear normal.     Left Ear: External ear normal.     Nose: Nose normal.     Mouth/Throat:     Mouth: Mucous membranes are moist.  Eyes:     General: No scleral icterus.       Right eye: No discharge.        Left eye: No discharge.  Cardiovascular:     Rate and Rhythm: Normal rate and regular rhythm.     Pulses: Normal pulses.     Heart sounds: Normal heart sounds.  Pulmonary:     Effort: Pulmonary effort is normal. No respiratory distress.     Breath sounds: Normal breath sounds.  Abdominal:     General: Abdomen is flat. Bowel sounds are normal.     Palpations: Abdomen is soft.     Tenderness: There is abdominal tenderness in the left lower quadrant.     Comments: Minimal tenderness palpation, no rebound, nonperitoneal abdomen  Musculoskeletal:        General: Normal range of motion.     Cervical back: Normal range of motion.     Right lower leg: No edema.     Left lower leg: No edema.  Skin:    General: Skin is warm and dry.     Capillary Refill: Capillary refill takes less than 2 seconds.  Neurological:     Mental Status: She is alert.  Psychiatric:        Mood and Affect: Mood normal.        Behavior: Behavior normal.    ED Results / Procedures / Treatments   Labs (all labs ordered are listed, but only abnormal results are displayed) Labs Reviewed  COMPREHENSIVE METABOLIC PANEL - Abnormal; Notable for the following components:      Result Value   Potassium 3.0 (*)    Glucose, Bld 142 (*)    Albumin 3.4 (*)    All other components within normal limits  URINALYSIS, ROUTINE W REFLEX MICROSCOPIC - Abnormal; Notable for the following components:   APPearance HAZY (*)    Hgb urine dipstick LARGE (*)    Ketones, ur 5 (*)    Protein, ur 30 (*)    Nitrite POSITIVE (*)    Leukocytes,Ua MODERATE (*)    RBC / HPF >50 (*)    Bacteria, UA MANY (*)    All other components within normal limits  URINE CULTURE  LIPASE, BLOOD  CBC  I-STAT  BETA HCG BLOOD, ED ([facility], [facility], [facility] ONLY)    EKG None  Radiology  CT ABDOMEN PELVIS W CONTRAST  Result Date: 03/08/2021 CLINICAL DATA:  Left lower quadrant abdominal pain. Patient reports left ovarian fallopian tube infection. EXAM: CT ABDOMEN AND PELVIS WITH CONTRAST TECHNIQUE: Multidetector CT imaging of the abdomen and pelvis was performed using the standard protocol following bolus administration of intravenous contrast. CONTRAST:  2mL OMNIPAQUE IOHEXOL 350 MG/ML SOLN COMPARISON:  Pelvic ultrasound 12/19/2020. Report from abdominopelvic CT 03/07/2017 FINDINGS: Lower chest: No acute airspace disease or pleural effusion. The heart is normal in size. Hepatobiliary: The liver is enlarged spanning 19.8 cm cranial caudal. No definite steatosis or focal abnormality. Gallbladder physiologically distended, no calcified stone. No biliary dilatation. Pancreas: No ductal dilatation or inflammation. Spleen: Mildly enlarged, measures 13.2 x 6.7 x 10.2 cm (volume = 470 cm^3). No focal abnormality. Adrenals/Urinary Tract: No adrenal nodule. No hydronephrosis or perinephric edema. Homogeneous renal enhancement with symmetric excretion on delayed phase imaging. No visualized stone or focal renal lesion. Urinary bladder is completely empty and not assessed. Stomach/Bowel: Detailed bowel assessment is limited in the absence of enteric contrast. The sigmoid colon is intimately related to the dilated tubular structure in the left adnexa with possible adhesions. No fat stranding or inflammation. Scattered colonic diverticula without diverticulitis. No colonic wall thickening. Normal appendix. Normal small bowel without small bowel obstruction or inflammatory change. The stomach is prominently distended with fluid. No gastric wall thickening. No duodenal wall thickening or inflammation. Vascular/Lymphatic: Normal caliber abdominal aorta. Mild distal aortic and bi-iliac atherosclerosis. Patent portal vein. No bulky abdominopelvic  adenopathy. Reproductive: Dilated tubular structure in the left adnexa suspicious for hydro or pyosalpinx. No associated fat stranding or inflammation. There is no focal fluid collection. Physiologic appearing right ovary with a 14 mm follicular cyst. Unremarkable CT appearance of the uterus. Other: No free fluid in the pelvis or upper abdomen. No free air. No abdominal wall hernia. Musculoskeletal: Mild scoliosis and lower lumbar facet hypertrophy. There are no acute or suspicious osseous abnormalities. IMPRESSION: 1. Dilated tubular structure in the left adnexa suspicious for hydro or pyosalpinx, also present on prior ultrasound. No associated fat stranding or inflammation. The sigmoid colon is intimately related to the fallopian tube in the left adnexa with possible adhesions. 2. Mild hepatosplenomegaly. 3. Colonic diverticulosis without diverticulitis. 4. Gastric distention with intraluminal fluid. No gastric wall thickening or obstructing lesion. Recommend correlation for recent p.o. intake, and clinical symptoms of gastroparesis. Aortic Atherosclerosis (ICD10-I70.0). Electronically Signed   By: Narda Rutherford M.D.   On: 03/08/2021 22:38    Procedures Procedures   Medications Ordered in ED Medications  cefTRIAXone (ROCEPHIN) 2 g in sodium chloride 0.9 % 100 mL IVPB (2 g Intravenous New Bag/Given 03/08/21 2312)  oxyCODONE-acetaminophen (PERCOCET/ROXICET) 5-325 MG per tablet 1 tablet (1 tablet Oral Given 03/08/21 2126)  sodium chloride 0.9 % bolus 1,000 mL (0 mLs Intravenous Stopped 03/08/21 2312)  potassium chloride SA (KLOR-CON) CR tablet 40 mEq (40 mEq Oral Given 03/08/21 2200)  ketorolac (TORADOL) 15 MG/ML injection 15 mg (15 mg Intravenous Given 03/08/21 2201)  iohexol (OMNIPAQUE) 350 MG/ML injection 80 mL (80 mLs Intravenous Contrast Given 03/08/21 2219)    ED Course  I have reviewed the triage vital signs and the nursing notes.  Pertinent labs & imaging results that were available  during my care of the patient were reviewed by me and considered in my medical decision making (see chart for details).    MDM Rules/Calculators/A&P  CC: abd pain  This patient complains of abd pain; this involves an extensive number of treatment options and is a complaint that carries with it a high risk of complications and morbidity. Vital signs were reviewed. Serious etiologies considered.  Record review:   Previous records obtained and reviewed   Work up as above, notable for:   Labs & imaging results that were available during my care of the patient were reviewed by me and considered in my medical decision making.   I ordered imaging studies which included CT a/p and I independently visualized and interpreted imaging which showed some chronic changes, possible adhesions, diverticulosis.   UA consistent with acute UTI, urine culture sent, start antibiotics. No pyelo.   Management: Give toradol, ivf, first dose of abx  Reassessment:  Patient is reports she is feeling better after intervention.  She is able tolerate oral intake without difficulty, no nausea vomiting  Patient with mild hypokalemia which was replaced orally.   Patient is requesting OB/GYN in the area that she can follow-up with for second opinion.  This was provided.  Patient also requesting regarding PCP which was provided.  The patient improved significantly and was discharged in stable condition. Detailed discussions were had with the patient regarding current findings, and need for close f/u with PCP or on call doctor. The patient has been instructed to return immediately if the symptoms worsen in any way for re-evaluation. Patient verbalized understanding and is in agreement with current care plan. All questions answered prior to discharge.           This chart was dictated using voice recognition software.  Despite best efforts to proofread,  errors can occur which can change  the documentation meaning.  Final Clinical Impression(s) / ED Diagnoses Final diagnoses:  Urinary tract infection with hematuria, site unspecified  Abdominal pain, unspecified abdominal location  Hypokalemia    Rx / DC Orders ED Discharge Orders          Ordered    cephALEXin (KEFLEX) 500 MG capsule  2 times daily        03/08/21 2247             Sloan Leiter, DO 03/08/21 2327

## 2021-03-11 LAB — URINE CULTURE: Culture: >=100000 — AB

## 2021-03-12 ENCOUNTER — Telehealth: Payer: Self-pay

## 2021-03-12 NOTE — Telephone Encounter (Signed)
Post ED Visit - Positive Culture Follow-up  Culture report reviewed by antimicrobial stewardship pharmacist: [facility] []  , Pharm.D. []  Enzo Bi, Pharm.D., BCPS AQ-ID []  , Pharm.D., BCPS []  Celedonio Miyamoto, Pharm.D., BCPS []  [place], Garvin Fila.D., BCPS, AAHIVP []  , Pharm.D., BCPS, AAHIVP []  Georgina Pillion, PharmD, BCPS []  , PharmD, BCPS []  [place], PharmD, BCPS []  [address], PharmD []  , PharmD, BCPS []  Estella Husk, PharmD  Pharmacy Team []  Lysle Pearl, PharmD []  , PharmD []  Phillips Climes, PharmD []  , Rph []  Agapito Games) , PharmD []  Verlan Friends, PharmD []  , PharmD []  Mervyn Gay, PharmD []  , PharmD []  Vinnie Level, PharmD [x]  [facility], PharmD []  , PharmD []  Len Childs, PharmD   Positive urine culture Treated with Cephalexin, organism sensitive to the same and no further patient follow-up is required at this time.  03/12/2021, 11:25 AM

## 2021-05-23 ENCOUNTER — Emergency Department (HOSPITAL_COMMUNITY)
Admission: EM | Admit: 2021-05-23 | Discharge: 2021-05-23 | Disposition: A | Discharge status: Home / Self Care | Payer: 59 | Attending: Emergency Medicine | Admitting: Emergency Medicine

## 2021-05-23 ENCOUNTER — Emergency Department (HOSPITAL_COMMUNITY): Payer: 59

## 2021-05-23 ENCOUNTER — Encounter (HOSPITAL_COMMUNITY): Payer: Self-pay

## 2021-05-23 DIAGNOSIS — Z23 Encounter for immunization: Secondary | ICD-10-CM | POA: Insufficient documentation

## 2021-05-23 DIAGNOSIS — M79662 Pain in left lower leg: Secondary | ICD-10-CM | POA: Insufficient documentation

## 2021-05-23 DIAGNOSIS — Y92410 Unspecified street and highway as the place of occurrence of the external cause: Secondary | ICD-10-CM | POA: Insufficient documentation

## 2021-05-23 DIAGNOSIS — R0789 Other chest pain: Secondary | ICD-10-CM | POA: Diagnosis not present

## 2021-05-23 DIAGNOSIS — R109 Unspecified abdominal pain: Secondary | ICD-10-CM | POA: Diagnosis not present

## 2021-05-23 DIAGNOSIS — S0990XA Unspecified injury of head, initial encounter: Secondary | ICD-10-CM | POA: Insufficient documentation

## 2021-05-23 LAB — I-STAT BETA HCG BLOOD, ED (MC, WL, AP ONLY): I-stat hCG, quantitative: <5 m[IU]/mL (ref <5)

## 2021-05-23 LAB — I-STAT CHEM 8, ED
BUN: 9 mg/dL (ref 6–20)
Calcium, Ion: 1.21 mmol/L (ref 1.15–1.40)
Chloride: 102 mmol/L (ref 98–111)
Creatinine, Ser: 0.7 mg/dL (ref 0.44–1.00)
Glucose, Bld: 114 mg/dL — ABNORMAL HIGH (ref 70–99)
HCT: 41 % (ref 36.0–46.0)
Hemoglobin: 13.9 g/dL (ref 12.0–15.0)
Potassium: 3.3 mmol/L — ABNORMAL LOW (ref 3.5–5.1)
Sodium: 140 mmol/L (ref 135–145)
TCO2: 24 mmol/L (ref 22–32)

## 2021-05-23 MED ORDER — IOHEXOL 300 MG/ML  SOLN
100.0000 mL | Freq: Once | INTRAMUSCULAR | Status: AC | PRN
  Administered 2021-05-23: 100 mL via INTRAVENOUS

## 2021-05-23 MED ORDER — HYDROXYZINE HCL 25 MG PO TABS: 25.0000 mg | ORAL_TABLET | Freq: Three times a day (TID) | ORAL | 0 refills | Status: AC | PRN

## 2021-05-23 MED ORDER — TETANUS-DIPHTH-ACELL PERTUSSIS 5-2.5-18.5 LF-MCG/0.5 IM SUSY
0.5000 mL | PREFILLED_SYRINGE | Freq: Once | INTRAMUSCULAR | Status: AC
  Administered 2021-05-23: 0.5 mL via INTRAMUSCULAR
  Filled 2021-05-23: qty 0.5

## 2021-05-23 MED ORDER — OXYCODONE-ACETAMINOPHEN 5-325 MG PO TABS
1.0000 | ORAL_TABLET | Freq: Once | ORAL | Status: AC
  Administered 2021-05-23: 1 via ORAL
  Filled 2021-05-23: qty 1

## 2021-05-23 MED ORDER — CYCLOBENZAPRINE HCL 10 MG PO TABS: 10.0000 mg | ORAL_TABLET | Freq: Two times a day (BID) | ORAL | 0 refills | Status: AC | PRN

## 2021-05-23 MED ORDER — HYDROXYZINE HCL 25 MG PO TABS
25.0000 mg | ORAL_TABLET | Freq: Once | ORAL | Status: AC
  Administered 2021-05-23: 25 mg via ORAL
  Filled 2021-05-23: qty 1

## 2021-05-23 NOTE — ED Notes (Signed)
Patient verbalizes understanding of d/c instructions. Opportunities for questions and answers were provided. Pt d/c from ED and wheeled to lobby where father is taking pt up.

## 2021-05-23 NOTE — Discharge Instructions (Addendum)
Lab work imaging all reassuring, you have a small bruise in your chest which should heal on its own, I given you a muscle relaxer please take as prescribed, may also take ibuprofen Tylenol as needed for pain.  For your anxiety and given you Vistaril please take as prescribed.  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  Come back to the emergency department if you develop chest pain, shortness of breath, severe abdominal pain, uncontrolled nausea, vomiting, diarrhea.

## 2021-05-23 NOTE — ED Notes (Addendum)
Pt's wounds cleaned and pain medication given.
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
## 2021-09-14 ENCOUNTER — Other Ambulatory Visit: Payer: Self-pay

## 2021-09-14 ENCOUNTER — Emergency Department (HOSPITAL_COMMUNITY)
Admission: EM | Admit: 2021-09-14 | Discharge: 2021-09-14 | Discharge status: Against Medical Advice | Payer: 59 | Attending: Emergency Medicine | Admitting: Emergency Medicine

## 2021-09-14 ENCOUNTER — Encounter (HOSPITAL_COMMUNITY): Payer: Self-pay

## 2021-09-14 DIAGNOSIS — Z20822 Contact with and (suspected) exposure to covid-19: Secondary | ICD-10-CM | POA: Diagnosis not present

## 2021-09-14 DIAGNOSIS — R531 Weakness: Secondary | ICD-10-CM | POA: Diagnosis present

## 2021-09-14 DIAGNOSIS — L02211 Cutaneous abscess of abdominal wall: Secondary | ICD-10-CM | POA: Diagnosis not present

## 2021-09-14 LAB — CBC
HCT: 40.5 % (ref 36.0–46.0)
Hemoglobin: 13.5 g/dL (ref 12.0–15.0)
MCH: 29.5 pg (ref 26.0–34.0)
MCHC: 33.3 g/dL (ref 30.0–36.0)
MCV: 88.6 fL (ref 80.0–100.0)
Platelets: 346 10*3/uL (ref 150–400)
RBC: 4.57 MIL/uL (ref 3.87–5.11)
RDW: 13.9 % (ref 11.5–15.5)
WBC: 9.3 10*3/uL (ref 4.0–10.5)
nRBC: 0 % (ref 0.0–0.2)

## 2021-09-14 LAB — BASIC METABOLIC PANEL
Anion gap: 8 (ref 5–15)
BUN: 11 mg/dL (ref 6–20)
CO2: 27 mmol/L (ref 22–32)
Calcium: 9 mg/dL (ref 8.9–10.3)
Chloride: 105 mmol/L (ref 98–111)
Creatinine, Ser: 0.84 mg/dL (ref 0.44–1.00)
GFR, Estimated: >60 mL/min (ref >60)
Glucose, Bld: 103 mg/dL — ABNORMAL HIGH (ref 70–99)
Potassium: 3.9 mmol/L (ref 3.5–5.1)
Sodium: 140 mmol/L (ref 135–145)

## 2021-09-14 LAB — CBG MONITORING, ED: Glucose-Capillary: 119 mg/dL — ABNORMAL HIGH (ref 70–99)

## 2021-09-14 LAB — RESP PANEL BY RT-PCR (FLU A&B, COVID) ARPGX2
Influenza A by PCR: NEGATIVE
Influenza B by PCR: NEGATIVE
SARS Coronavirus 2 by RT PCR: NEGATIVE

## 2021-09-14 MED ORDER — DOXYCYCLINE HYCLATE 100 MG PO CAPS: 100.0000 mg | ORAL_CAPSULE | Freq: Two times a day (BID) | ORAL | 0 refills | Status: AC

## 2021-09-14 NOTE — ED Triage Notes (Signed)
Patient with multiple complaints. She is having weakness, small abscesses, insomnia, shortness of breath, and chills that has been going on for several days.  ?
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
## 2022-08-24 ENCOUNTER — Other Ambulatory Visit: Payer: Self-pay | Admitting: *Deleted

## 2022-08-24 DIAGNOSIS — Z1231 Encounter for screening mammogram for malignant neoplasm of breast: Secondary | ICD-10-CM

## 2022-08-30 ENCOUNTER — Inpatient Hospital Stay: Admission: RE | Admit: 2022-08-30 | Payer: 59 | Source: Ambulatory Visit

## 2023-07-28 ENCOUNTER — Emergency Department (HOSPITAL_BASED_OUTPATIENT_CLINIC_OR_DEPARTMENT_OTHER)
Admission: EM | Admit: 2023-07-28 | Discharge: 2023-07-28 | Disposition: A | Discharge status: Home / Self Care | Payer: Self-pay | Attending: Emergency Medicine | Admitting: Emergency Medicine

## 2023-07-28 ENCOUNTER — Other Ambulatory Visit: Payer: Self-pay

## 2023-07-28 ENCOUNTER — Other Ambulatory Visit (HOSPITAL_BASED_OUTPATIENT_CLINIC_OR_DEPARTMENT_OTHER): Payer: Self-pay

## 2023-07-28 ENCOUNTER — Encounter (HOSPITAL_BASED_OUTPATIENT_CLINIC_OR_DEPARTMENT_OTHER): Payer: Self-pay | Admitting: Emergency Medicine

## 2023-07-28 DIAGNOSIS — D72829 Elevated white blood cell count, unspecified: Secondary | ICD-10-CM | POA: Insufficient documentation

## 2023-07-28 DIAGNOSIS — N3090 Cystitis, unspecified without hematuria: Secondary | ICD-10-CM | POA: Insufficient documentation

## 2023-07-28 DIAGNOSIS — R1013 Epigastric pain: Secondary | ICD-10-CM

## 2023-07-28 LAB — URINALYSIS, ROUTINE W REFLEX MICROSCOPIC
Bilirubin Urine: NEGATIVE
Glucose, UA: NEGATIVE mg/dL
Hgb urine dipstick: NEGATIVE
Ketones, ur: NEGATIVE mg/dL
Nitrite: NEGATIVE
Protein, ur: NEGATIVE mg/dL
Specific Gravity, Urine: 1.016 (ref 1.005–1.030)
pH: 6.5 (ref 5.0–8.0)

## 2023-07-28 LAB — WET PREP, GENITAL
Sperm: NONE SEEN
Trich, Wet Prep: NONE SEEN
WBC, Wet Prep HPF POC: <10 (ref <10)
Yeast Wet Prep HPF POC: NONE SEEN

## 2023-07-28 LAB — COMPREHENSIVE METABOLIC PANEL
ALT: 8 U/L (ref 0–44)
AST: 12 U/L — ABNORMAL LOW (ref 15–41)
Albumin: 3.7 g/dL (ref 3.5–5.0)
Alkaline Phosphatase: 68 U/L (ref 38–126)
Anion gap: 7 (ref 5–15)
BUN: 9 mg/dL (ref 6–20)
CO2: 26 mmol/L (ref 22–32)
Calcium: 9.3 mg/dL (ref 8.9–10.3)
Chloride: 106 mmol/L (ref 98–111)
Creatinine, Ser: 0.68 mg/dL (ref 0.44–1.00)
GFR, Estimated: >60 mL/min (ref >60)
Glucose, Bld: 97 mg/dL (ref 70–99)
Potassium: 3.9 mmol/L (ref 3.5–5.1)
Sodium: 139 mmol/L (ref 135–145)
Total Bilirubin: 0.3 mg/dL (ref 0.0–1.2)
Total Protein: 7.3 g/dL (ref 6.5–8.1)

## 2023-07-28 LAB — CBC
HCT: 35.7 % — ABNORMAL LOW (ref 36.0–46.0)
Hemoglobin: 12.2 g/dL (ref 12.0–15.0)
MCH: 29.3 pg (ref 26.0–34.0)
MCHC: 34.2 g/dL (ref 30.0–36.0)
MCV: 85.8 fL (ref 80.0–100.0)
Platelets: 307 10*3/uL (ref 150–400)
RBC: 4.16 MIL/uL (ref 3.87–5.11)
RDW: 12.7 % (ref 11.5–15.5)
WBC: 9.6 10*3/uL (ref 4.0–10.5)
nRBC: 0 % (ref 0.0–0.2)

## 2023-07-28 LAB — HIV ANTIBODY (ROUTINE TESTING W REFLEX): HIV Screen 4th Generation wRfx: NONREACTIVE

## 2023-07-28 LAB — PREGNANCY, URINE: Preg Test, Ur: NEGATIVE

## 2023-07-28 LAB — LIPASE, BLOOD: Lipase: 20 U/L (ref 11–51)

## 2023-07-28 MED ORDER — METRONIDAZOLE 500 MG PO TABS
500.0000 mg | ORAL_TABLET | Freq: Two times a day (BID) | ORAL | 0 refills | Status: AC
  Filled 2023-07-28: qty 14, 7d supply, fill #0

## 2023-07-28 MED ORDER — FLUCONAZOLE 150 MG PO TABS
150.0000 mg | ORAL_TABLET | Freq: Every day | ORAL | 0 refills | Status: AC
  Filled 2023-07-28: qty 1, 1d supply, fill #0

## 2023-07-28 MED ORDER — PANTOPRAZOLE SODIUM 20 MG PO TBEC
20.0000 mg | DELAYED_RELEASE_TABLET | Freq: Every day | ORAL | 0 refills | Status: AC
  Filled 2023-07-28: qty 30, 30d supply, fill #0

## 2023-07-28 MED ORDER — CIPROFLOXACIN HCL 500 MG PO TABS
500.0000 mg | ORAL_TABLET | Freq: Two times a day (BID) | ORAL | 0 refills | Status: AC
  Filled 2023-07-28: qty 11, 6d supply, fill #0
  Filled 2023-07-28: qty 3, 1d supply, fill #0

## 2023-07-28 NOTE — ED Provider Notes (Signed)
 [place] EMERGENCY DEPARTMENT AT [facility] Provider Note   CSN: [id number] Arrival date & time: 07/28/23  1415     History  Chief Complaint  Patient presents with   Abdominal Pain    Cristina Hicks is a 43 y.o. female.  Presenting to the ED for evaluation of multiple complaints.  She states she has had a sharp pain after urination, intermittent right-sided low back pain, postprandial epigastric pain.  The symptoms have been present for at least 3 weeks.  She was incarcerated for 18 days and recently discharged.  No recent change but states that she has been unable to establish care with a PCP so she comes to the emergency department today.  She denies any nausea or vomiting.  No fevers or chills.  She reports frequency but states that she drinks about a gallon of water per day.  No urgency.  She denies any vaginal itching, odor, discharge, pain.  She was struggling with constipation while incarcerated but states that that has since improved.  She did have some diarrhea shortly after discharge but her bowels have returned to normal.   Abdominal Pain Associated symptoms: dysuria        Home Medications Prior to Admission medications   Medication Sig Start Date End Date Taking? Authorizing Provider  ciprofloxacin (CIPRO) 500 MG tablet Take 1 tablet (500 mg total) by mouth every 12 (twelve) hours for 7 days. 07/28/23 08/04/23 Yes Clarinda Obi, Edsel Petrin, PA-C  fluconazole (DIFLUCAN) 150 MG tablet Take 1 tablet (150 mg total) by mouth daily. 07/28/23  Yes Kamin Niblack, Edsel Petrin, PA-C  metroNIDAZOLE (FLAGYL) 500 MG tablet Take 1 tablet (500 mg total) by mouth 2 (two) times daily. 07/28/23  Yes Shakeema Lippman, Edsel Petrin, PA-C  pantoprazole (PROTONIX) 20 MG tablet Take 1 tablet (20 mg total) by mouth daily. 07/28/23  Yes Vaun Hyndman, Edsel Petrin, PA-C  cyclobenzaprine (FLEXERIL) 10 MG tablet Take 1 tablet (10 mg total) by mouth 2 (two) times daily as needed for muscle spasms. 05/23/21   Carroll Sage, PA-C  doxycycline (VIBRAMYCIN) 100 MG capsule Take 1 capsule (100 mg total) by mouth 2 (two) times daily. 09/14/21   Pollyann Savoy, MD  hydrOXYzine (ATARAX) 25 MG tablet Take 1 tablet (25 mg total) by mouth 3 (three) times daily as needed for anxiety. 05/23/21   Carroll Sage, PA-C  polyethylene glycol [facility]) packet Take 17 g by mouth daily. Patient not taking: Reported on 05/23/2021 04/23/18   Vanetta Mulders, MD      Allergies    Tramadol    Review of Systems   Review of Systems  Gastrointestinal:  Positive for abdominal pain.  Genitourinary:  Positive for dysuria and frequency.  Musculoskeletal:  Positive for back pain.  All other systems reviewed and are negative.   Physical Exam Updated Vital Signs BP (!) 126/91 (BP Location: Right Arm)   Pulse 71   Temp 98.9 F (37.2 C)   Resp 18   Wt 117.9 kg   LMP 07/18/2023   SpO2 98%   BMI 43.27 kg/m  Physical Exam Vitals and nursing note reviewed.  Constitutional:      General: She is not in acute distress.    Appearance: She is well-developed. She is obese. She is not ill-appearing.     Comments: Resting comfortably in bed  HENT:     Head: Normocephalic and atraumatic.  Eyes:     Conjunctiva/sclera: Conjunctivae normal.  Cardiovascular:     Rate and Rhythm:  Normal rate and regular rhythm.     Heart sounds: No murmur heard. Pulmonary:     Effort: Pulmonary effort is normal. No respiratory distress.     Breath sounds: Normal breath sounds. No wheezing, rhonchi or rales.  Abdominal:     Palpations: Abdomen is soft.     Tenderness: There is no abdominal tenderness. There is no right CVA tenderness, left CVA tenderness or guarding.  Musculoskeletal:        General: No swelling.     Cervical back: Neck supple.  Skin:    General: Skin is warm and dry.     Capillary Refill: Capillary refill takes less than 2 seconds.  Neurological:     Mental Status: She is alert.  Psychiatric:        Mood and Affect:  Mood normal.     ED Results / Procedures / Treatments   Labs (all labs ordered are listed, but only abnormal results are displayed) Labs Reviewed  WET PREP, GENITAL - Abnormal; Notable for the following components:      Result Value   Clue Cells Wet Prep HPF POC PRESENT (*)    All other components within normal limits  COMPREHENSIVE METABOLIC PANEL - Abnormal; Notable for the following components:   AST 12 (*)    All other components within normal limits  CBC - Abnormal; Notable for the following components:   HCT 35.7 (*)    All other components within normal limits  URINALYSIS, ROUTINE W REFLEX MICROSCOPIC - Abnormal; Notable for the following components:   Leukocytes,Ua SMALL (*)    Bacteria, UA MANY (*)    All other components within normal limits  URINE CULTURE  LIPASE, BLOOD  PREGNANCY, URINE  RPR  HIV ANTIBODY (ROUTINE TESTING W REFLEX)  GC/CHLAMYDIA PROBE AMP ([place]) NOT AT [facility]    EKG None  Radiology No results found.  Procedures Procedures    Medications Ordered in ED Medications - No data to display  ED Course/ Medical Decision Making/ A&P                                 Medical Decision Making Amount and/or Complexity of Data Reviewed Labs: ordered.  This patient presents to the ED for concern of dysuria, right-sided back pain, epigastric abdominal pain after eating, this involves an extensive number of treatment options, and is a complaint that carries with it a high risk of complications and morbidity.  The differential diagnosis for her dysuria includes cystitis, vaginitis, pyelonephritis, nephrolithiasis.  Differential diagnosis for her epigastric postprandial pain includes biliary colic, dyspepsia, gastric ulcer.  This is not an exhaustive differential  My initial workup includes labs  Additional history obtained from: Nursing notes from this visit.  I ordered, reviewed and interpreted labs which include: CBC, CMP, lipase, urinalysis,  urine pregnancy, STI panel.  No leukocytosis or anemia.  No electrolyte derangement or kidney dysfunction.  Normal LFTs.  Urine with small leukocytes, 11-20 white blood cells and many bacteria.  Will send for culture.  Afebrile, hemodynamically stable.  43 year old female presenting to the ED for evaluation of multiple complaints.  She is complaining of dysuria, intermittent right-sided back pain, postprandial epigastric pain.  Symptoms have been present for the past 3 weeks.  She appears very well on physical exam.  No abdominal tenderness.  No CVA tenderness.  Urine consistent with UTI with leukocytes, blood cells and bacteria.  Culture was  sent.  Wet prep positive for bacterial vaginosis.  Will treat for pyelonephritis and bacterial vaginosis.  Will treat postprandial epigastric pain with Protonix.  Will also send prescription for Diflucan as she typically gets a yeast infection after antibiotics.  She is encouraged to follow-up with her primary care provider.  She was given return precautions.  Stable at discharge.  At this time there does not appear to be any evidence of an acute emergency medical condition and the patient appears stable for discharge with appropriate outpatient follow up. Diagnosis was discussed with patient who verbalizes understanding of care plan and is agreeable to discharge. I have discussed return precautions with patient who verbalizes understanding. Patient encouraged to follow-up with their PCP within 1 week. All questions answered.  Note: Portions of this report may have been transcribed using voice recognition software. Every effort was made to ensure accuracy; however, inadvertent computerized transcription errors may still be present.        Final Clinical Impression(s) / ED Diagnoses Final diagnoses:  Cystitis  Epigastric abdominal pain    Rx / DC Orders ED Discharge Orders          Ordered    ciprofloxacin (CIPRO) 500 MG tablet  Every 12 hours         07/28/23 1651    metroNIDAZOLE (FLAGYL) 500 MG tablet  2 times daily        07/28/23 1651    pantoprazole (PROTONIX) 20 MG tablet  Daily        07/28/23 1651    fluconazole (DIFLUCAN) 150 MG tablet  Daily        07/28/23 1651              Michelle Piper, Cordelia Poche 07/28/23 1651    Tegeler, Canary Brim, MD 07/28/23 [phone]

## 2023-07-28 NOTE — Discharge Instructions (Signed)
 You have been seen today for your complaint of abdominal pain, back pain, urinary complaints. Your lab work showed bacterial vaginosis and a UTI. Your discharge medications include metronidazole and ciprofloxacin. This is an antibiotic. You should take it as prescribed. You should take it for the entire duration of the prescription. This may cause an upset stomach. This is normal. You may take this with food. You may also eat yogurt to prevent diarrhea. Protonix.  This is a medicine used to help with your abdominal pain.  Take this once daily for at least 30 days Follow up with: A primary care provider of your choice Please seek immediate medical care if you develop any of the following symptoms: You vomit each time that you eat or drink. You have very bad pain in your back or side. You are very weak, or you faint. At this time there does not appear to be the presence of an emergent medical condition, however there is always the potential for conditions to change. Please read and follow the below instructions.  Do not take your medicine if  develop an itchy rash, swelling in your mouth or lips, or difficulty breathing; call 911 and seek immediate emergency medical attention if this occurs.  You may review your lab tests and imaging results in their entirety on your MyChart account.  Please discuss all results of fully with your primary care provider and other specialist at your follow-up visit.  Note: Portions of this text may have been transcribed using voice recognition software. Every effort was made to ensure accuracy; however, inadvertent computerized transcription errors may still be present.

## 2023-07-28 NOTE — ED Triage Notes (Signed)
 Pt reports recently jail incarceration, c/o intermittent RT lower back pain, vaginal pain when urinating, and abd pain with nausea after eating. Then reports symptoms started before being incarcerated

## 2023-07-28 NOTE — ED Notes (Signed)
 Dc instructions reviewed with patient. Patient voiced understanding. Dc with belongings.

## 2023-07-29 LAB — GC/CHLAMYDIA PROBE AMP (~~LOC~~) NOT AT ARMC
Chlamydia: NEGATIVE
Comment: NEGATIVE
Comment: NORMAL
Neisseria Gonorrhea: NEGATIVE

## 2023-07-29 LAB — RPR: RPR Ser Ql: NONREACTIVE

## 2023-07-30 LAB — URINE CULTURE: Culture: >=100000 — AB

## 2023-07-31 ENCOUNTER — Telehealth (HOSPITAL_BASED_OUTPATIENT_CLINIC_OR_DEPARTMENT_OTHER): Payer: Self-pay | Admitting: *Deleted

## 2023-07-31 NOTE — Telephone Encounter (Signed)
 Post ED Visit - Positive Culture Follow-up  Culture report reviewed by antimicrobial stewardship pharmacist: [facility] []  Enzo Bi, Pharm.D. []  Celedonio Miyamoto, Pharm.D., BCPS AQ-ID []  Garvin Fila, Pharm.D., BCPS []  Georgina Pillion, Pharm.D., BCPS []  [place], [address].D., BCPS, AAHIVP []  Estella Husk, Pharm.D., BCPS, AAHIVP []  Lysle Pearl, PharmD, BCPS []  Phillips Climes, PharmD, BCPS []  Agapito Games, PharmD, BCPS []  Verlan Friends, PharmD []  Mervyn Gay, PharmD, BCPS [x]  Delmar Landau, PharmD  [facility] []  Len Childs, PharmD []  Greer Pickerel, PharmD []  Adalberto Cole, PharmD []  Perlie Gold, Rph []  Lonell Face) Jean Rosenthal, PharmD []  Earl Many, PharmD []  Junita Push, PharmD []  Dorna Leitz, PharmD []  Terrilee Files, PharmD []  Lynann Beaver, PharmD []  Keturah Barre, PharmD []  Loralee Pacas, PharmD []  Bernadene Person, PharmD   Positive urine culture Treated with Ciprofloxacin, organism sensitive to the same and no further patient follow-up is required at this time.  Patsey Berthold 07/31/2023, 2:07 PM

## 2023-08-06 ENCOUNTER — Emergency Department (HOSPITAL_BASED_OUTPATIENT_CLINIC_OR_DEPARTMENT_OTHER): Payer: Self-pay | Admitting: Radiology

## 2023-08-06 ENCOUNTER — Other Ambulatory Visit: Payer: Self-pay

## 2023-08-06 ENCOUNTER — Encounter (HOSPITAL_BASED_OUTPATIENT_CLINIC_OR_DEPARTMENT_OTHER): Payer: Self-pay | Admitting: Emergency Medicine

## 2023-08-06 ENCOUNTER — Emergency Department (HOSPITAL_BASED_OUTPATIENT_CLINIC_OR_DEPARTMENT_OTHER)
Admission: EM | Admit: 2023-08-06 | Discharge: 2023-08-06 | Disposition: A | Discharge status: Home / Self Care | Payer: Self-pay | Attending: Emergency Medicine | Admitting: Emergency Medicine

## 2023-08-06 DIAGNOSIS — R079 Chest pain, unspecified: Secondary | ICD-10-CM

## 2023-08-06 DIAGNOSIS — Z720 Tobacco use: Secondary | ICD-10-CM | POA: Insufficient documentation

## 2023-08-06 DIAGNOSIS — R059 Cough, unspecified: Secondary | ICD-10-CM | POA: Insufficient documentation

## 2023-08-06 DIAGNOSIS — R0789 Other chest pain: Secondary | ICD-10-CM | POA: Insufficient documentation

## 2023-08-06 DIAGNOSIS — R0989 Other specified symptoms and signs involving the circulatory and respiratory systems: Secondary | ICD-10-CM | POA: Insufficient documentation

## 2023-08-06 DIAGNOSIS — R0981 Nasal congestion: Secondary | ICD-10-CM | POA: Insufficient documentation

## 2023-08-06 LAB — BASIC METABOLIC PANEL
Anion gap: 11 (ref 5–15)
BUN: 9 mg/dL (ref 6–20)
CO2: 27 mmol/L (ref 22–32)
Calcium: 9.2 mg/dL (ref 8.9–10.3)
Chloride: 100 mmol/L (ref 98–111)
Creatinine, Ser: 0.73 mg/dL (ref 0.44–1.00)
GFR, Estimated: >60 mL/min (ref >60)
Glucose, Bld: 122 mg/dL — ABNORMAL HIGH (ref 70–99)
Potassium: 3.4 mmol/L — ABNORMAL LOW (ref 3.5–5.1)
Sodium: 138 mmol/L (ref 135–145)

## 2023-08-06 LAB — CBC
HCT: 34.7 % — ABNORMAL LOW (ref 36.0–46.0)
Hemoglobin: 11.9 g/dL — ABNORMAL LOW (ref 12.0–15.0)
MCH: 29.4 pg (ref 26.0–34.0)
MCHC: 34.3 g/dL (ref 30.0–36.0)
MCV: 85.7 fL (ref 80.0–100.0)
Platelets: 396 10*3/uL (ref 150–400)
RBC: 4.05 MIL/uL (ref 3.87–5.11)
RDW: 12.9 % (ref 11.5–15.5)
WBC: 11.3 10*3/uL — ABNORMAL HIGH (ref 4.0–10.5)
nRBC: 0 % (ref 0.0–0.2)

## 2023-08-06 LAB — RESP PANEL BY RT-PCR (RSV, FLU A&B, COVID)  RVPGX2
Influenza A by PCR: NEGATIVE
Influenza B by PCR: NEGATIVE
Resp Syncytial Virus by PCR: NEGATIVE
SARS Coronavirus 2 by RT PCR: NEGATIVE

## 2023-08-06 LAB — PREGNANCY, URINE: Preg Test, Ur: NEGATIVE

## 2023-08-06 LAB — TROPONIN I (HIGH SENSITIVITY): Troponin I (High Sensitivity): 3 ng/L (ref <18)

## 2023-08-06 NOTE — Discharge Instructions (Addendum)
 You were seen in the emergency department for your chest pain.  Your workup showed no signs of heart attack or stress on your heart, no abnormalities with your lungs and no signs of infection.  Unclear what is causing your chest pain at this time but you can take Tylenol or Motrin as needed for pain and can follow-up with your primary doctor in the next few days to have your symptoms rechecked.  You should return to the emergency department if you are having significantly worsening pain, severe shortness of breath, if you pass out or if you have any other new or concerning symptoms.

## 2023-08-06 NOTE — ED Notes (Signed)
 Transported to xray

## 2023-08-06 NOTE — ED Triage Notes (Signed)
 Pt reports cough, chest pain, and SHOB that started this morning. Denies fevers.

## 2023-08-06 NOTE — ED Provider Notes (Signed)
 [place] EMERGENCY DEPARTMENT AT [facility] Provider Note   CSN: [id number] Arrival date & time: 08/06/23  1758     History  Chief Complaint  Patient presents with   Cough    Cristina Hicks is a 43 y.o. female.  Patient is a 43 year old female with no significant past medical history presenting to the emergency department with chest pain.  Patient states that she was watching TV this morning when she suddenly developed right-sided chest pain.  She states that it feels like a sharp type of pressure.  She states that the pain has been constant since it started.  She states that she tried taking Tylenol at home but states that the pain got worse and made her start to feel like she was breathing abnormally and a little short of breath.  She states the pain is worse with any movement. She states that she feels like her heart is skipping beats.  She denies any fever or cough but states that she has had some runny nose and congestion.  She denies any lower extremity swelling.  He states that she has had chronic nausea for about the past 2 weeks but denies any vomiting.  She denies any history of VTE, any recent hospitalization or surgery, recent travel in car or plane, cancer history or hormone use.  The history is provided by the patient.  Cough      Home Medications Prior to Admission medications   Medication Sig Start Date End Date Taking? Authorizing Provider  cyclobenzaprine (FLEXERIL) 10 MG tablet Take 1 tablet (10 mg total) by mouth 2 (two) times daily as needed for muscle spasms. 05/23/21   Carroll Sage, PA-C  doxycycline (VIBRAMYCIN) 100 MG capsule Take 1 capsule (100 mg total) by mouth 2 (two) times daily. 09/14/21   Pollyann Savoy, MD  fluconazole (DIFLUCAN) 150 MG tablet Take 1 tablet (150 mg total) by mouth daily. 07/28/23   Schutt, Edsel Petrin, PA-C  hydrOXYzine (ATARAX) 25 MG tablet Take 1 tablet (25 mg total) by mouth 3 (three) times daily as needed for  anxiety. 05/23/21   Carroll Sage, PA-C  metroNIDAZOLE (FLAGYL) 500 MG tablet Take 1 tablet (500 mg total) by mouth 2 (two) times daily. 07/28/23   Schutt, Edsel Petrin, PA-C  pantoprazole (PROTONIX) 20 MG tablet Take 1 tablet (20 mg total) by mouth daily. 07/28/23   Schutt, Edsel Petrin, PA-C  polyethylene glycol [facility]) packet Take 17 g by mouth daily. Patient not taking: Reported on 05/23/2021 04/23/18   Vanetta Mulders, MD      Allergies    Tramadol    Review of Systems   Review of Systems  Respiratory:  Positive for cough.     Physical Exam Updated Vital Signs BP 113/77   Pulse 88   Temp 98.5 F (36.9 C) (Oral)   Resp 12   Ht 5\' 5"  (1.651 m)   Wt 117.9 kg   LMP 07/18/2023   SpO2 95%   BMI 43.27 kg/m  Physical Exam Vitals and nursing note reviewed.  Constitutional:      General: She is not in acute distress.    Appearance: Normal appearance. She is obese.  HENT:     Head: Normocephalic and atraumatic.     Nose: Nose normal.     Mouth/Throat:     Mouth: Mucous membranes are moist.     Pharynx: Oropharynx is clear.  Eyes:     Extraocular Movements: Extraocular movements intact.  Conjunctiva/sclera: Conjunctivae normal.  Cardiovascular:     Rate and Rhythm: Normal rate and regular rhythm.     Heart sounds: Normal heart sounds.  Pulmonary:     Effort: Pulmonary effort is normal.     Breath sounds: Normal breath sounds.  Abdominal:     General: Abdomen is flat.     Palpations: Abdomen is soft.     Tenderness: There is no abdominal tenderness.  Musculoskeletal:        General: Normal range of motion.     Cervical back: Normal range of motion.     Right lower leg: No edema.     Left lower leg: No edema.  Skin:    General: Skin is warm and dry.  Neurological:     General: No focal deficit present.     Mental Status: She is alert and oriented to person, place, and time.  Psychiatric:        Mood and Affect: Mood normal.        Behavior: Behavior normal.      ED Results / Procedures / Treatments   Labs (all labs ordered are listed, but only abnormal results are displayed) Labs Reviewed  BASIC METABOLIC PANEL - Abnormal; Notable for the following components:      Result Value   Potassium 3.4 (*)    Glucose, Bld 122 (*)    All other components within normal limits  CBC - Abnormal; Notable for the following components:   WBC 11.3 (*)    Hemoglobin 11.9 (*)    HCT 34.7 (*)    All other components within normal limits  RESP PANEL BY RT-PCR (RSV, FLU A&B, COVID)  RVPGX2  PREGNANCY, URINE  TROPONIN I (HIGH SENSITIVITY)    EKG EKG Interpretation Date/Time:  Saturday August 06 2023 18:08:02 EDT Ventricular Rate:  88 PR Interval:  156 QRS Duration:  97 QT Interval:  398 QTC Calculation: 482 R Axis:   -25  Text Interpretation: Sinus rhythm Borderline left axis deviation Low voltage, precordial leads No significant change since last tracing Confirmed by Elayne Snare ([id number]) on 08/06/2023 6:10:39 PM  Radiology DG Chest 2 View Result Date: 08/06/2023 CLINICAL DATA:  Chest pain cough EXAM: CHEST - 2 VIEW COMPARISON:  02/03/2020 FINDINGS: The heart size and mediastinal contours are within normal limits. Both lungs are clear. The visualized skeletal structures are unremarkable. IMPRESSION: No active cardiopulmonary disease. Electronically Signed   By: Jasmine Pang M.D.   On: 08/06/2023 19:27    Procedures Procedures    Medications Ordered in ED Medications - No data to display  ED Course/ Medical Decision Making/ A&P Clinical Course as of 08/06/23 2014  Sat Aug 06, 2023  1910 Mild leukocytosis, otherwise labs normal. Troponin negative, symptoms ongoing since this morning so single troponin is sufficient. Viral swab and CXR read pending at this time. [VK]  1946 No acute disease on CXR. Chest pain appears low risk. She is stable for discharge home with outpatient follow up. [VK]    Clinical Course User Index [VK] Rexford Maus, DO                                 Medical Decision Making This patient presents to the ED with chief complaint(s) of chest pain with pertinent past medical history of tobacco use which further complicates the presenting complaint. The complaint involves an extensive differential diagnosis and also carries with  it a high risk of complications and morbidity.    The differential diagnosis includes ACS, arrhythmia, anemia, pneumonia, pneumothorax, pulmonary edema, pleural effusion, viral syndrome, costochondritis or other musculoskeletal pain, she is PERC negative making PE unlikely  Additional history obtained: Additional history obtained from family Records reviewed recent ED records  ED Course and Reassessment: On patient's arrival she is hemodynamically stable in no acute distress.  EKG on arrival showed normal sinus rhythm without acute ischemic changes.  Patient will have labs including troponin, viral swab and chest x-ray performed.  She declined any pain control at this time and will be closely reassessed.  Independent labs interpretation:  The following labs were independently interpreted: within normal range  Independent visualization of imaging: - I independently visualized the following imaging with scope of interpretation limited to determining acute life threatening conditions related to emergency care: CXR, which revealed no acute disease  Consultation: - Consulted or discussed management/test interpretation w/ external professional: N/A  Consideration for admission or further workup: Patient has no emergent conditions requiring admission or further work-up at this time and is stable for discharge home with primary care follow-up  Social Determinants of health: N/A    Amount and/or Complexity of Data Reviewed Labs: ordered. Radiology: ordered.          Final Clinical Impression(s) / ED Diagnoses Final diagnoses:  Nonspecific chest pain    Rx /  DC Orders ED Discharge Orders     None         Rexford Maus, DO 08/06/23 2014
